# Patient Record
Sex: Female | Born: 1945
Health system: Southern US, Community
[De-identification: ages and names within clinical notes are randomized; demographics above are authoritative.]

## PROBLEM LIST (undated history)

## (undated) DIAGNOSIS — I700 Atherosclerosis of aorta: Secondary | ICD-10-CM

## (undated) DIAGNOSIS — E785 Hyperlipidemia, unspecified: Secondary | ICD-10-CM

## (undated) DIAGNOSIS — N281 Cyst of kidney, acquired: Secondary | ICD-10-CM

## (undated) DIAGNOSIS — I10 Essential (primary) hypertension: Secondary | ICD-10-CM

## (undated) DIAGNOSIS — K219 Gastro-esophageal reflux disease without esophagitis: Secondary | ICD-10-CM

## (undated) DIAGNOSIS — M170 Bilateral primary osteoarthritis of knee: Secondary | ICD-10-CM

## (undated) HISTORY — DX: Hyperlipidemia, unspecified: E78.5

## (undated) HISTORY — DX: Bilateral primary osteoarthritis of knee: M17.0

## (undated) HISTORY — DX: Essential (primary) hypertension: I10

## (undated) HISTORY — DX: Cyst of kidney, acquired: N28.1

## (undated) HISTORY — DX: Atherosclerosis of aorta: I70.0

## (undated) HISTORY — DX: Gastro-esophageal reflux disease without esophagitis: K21.9

## (undated) HISTORY — PX: KNEE SURGERY: SHX244

## (undated) HISTORY — DX: Morbid (severe) obesity due to excess calories: E66.01

## (undated) HISTORY — PX: TONSILLECTOMY: SUR1361

---

## 1989-11-09 HISTORY — PX: CHOLECYSTECTOMY: SHX55

## 2001-05-02 ENCOUNTER — Ambulatory Visit (HOSPITAL_COMMUNITY): Admission: RE | Admit: 2001-05-02 | Discharge: 2001-05-02 | Payer: Self-pay | Admitting: Obstetrics and Gynecology

## 2001-08-11 ENCOUNTER — Ambulatory Visit (HOSPITAL_COMMUNITY): Admission: RE | Admit: 2001-08-11 | Discharge: 2001-08-11 | Payer: Self-pay | Admitting: *Deleted

## 2001-08-11 ENCOUNTER — Encounter: Payer: Self-pay | Admitting: *Deleted

## 2002-07-07 ENCOUNTER — Other Ambulatory Visit: Admission: RE | Admit: 2002-07-07 | Discharge: 2002-07-07 | Payer: Self-pay | Admitting: Obstetrics and Gynecology

## 2003-03-30 ENCOUNTER — Encounter
Admission: RE | Admit: 2003-03-30 | Discharge: 2003-03-30 | Payer: Self-pay | Admitting: Physical Medicine and Rehabilitation

## 2003-03-30 ENCOUNTER — Encounter: Payer: Self-pay | Admitting: Radiology

## 2003-03-30 ENCOUNTER — Encounter: Payer: Self-pay | Admitting: Physical Medicine and Rehabilitation

## 2003-12-03 ENCOUNTER — Ambulatory Visit (HOSPITAL_COMMUNITY): Admission: RE | Admit: 2003-12-03 | Discharge: 2003-12-03 | Payer: Self-pay | Admitting: *Deleted

## 2004-06-16 ENCOUNTER — Encounter: Admission: RE | Admit: 2004-06-16 | Discharge: 2004-06-16 | Payer: Self-pay | Admitting: Orthopedic Surgery

## 2004-12-04 ENCOUNTER — Encounter: Admission: RE | Admit: 2004-12-04 | Discharge: 2004-12-04 | Payer: Self-pay | Admitting: Orthopedic Surgery

## 2005-01-22 ENCOUNTER — Encounter: Admission: RE | Admit: 2005-01-22 | Discharge: 2005-01-22 | Payer: Self-pay | Admitting: Neurosurgery

## 2013-10-24 DIAGNOSIS — Z0000 Encounter for general adult medical examination without abnormal findings: Secondary | ICD-10-CM | POA: Diagnosis not present

## 2013-12-15 DIAGNOSIS — I10 Essential (primary) hypertension: Secondary | ICD-10-CM | POA: Diagnosis not present

## 2013-12-15 DIAGNOSIS — J069 Acute upper respiratory infection, unspecified: Secondary | ICD-10-CM | POA: Diagnosis not present

## 2013-12-15 DIAGNOSIS — E7800 Pure hypercholesterolemia, unspecified: Secondary | ICD-10-CM | POA: Diagnosis not present

## 2014-01-30 DIAGNOSIS — M5430 Sciatica, unspecified side: Secondary | ICD-10-CM | POA: Diagnosis not present

## 2014-02-27 DIAGNOSIS — I10 Essential (primary) hypertension: Secondary | ICD-10-CM | POA: Diagnosis not present

## 2014-02-27 DIAGNOSIS — Z0000 Encounter for general adult medical examination without abnormal findings: Secondary | ICD-10-CM | POA: Diagnosis not present

## 2014-02-27 DIAGNOSIS — K219 Gastro-esophageal reflux disease without esophagitis: Secondary | ICD-10-CM | POA: Diagnosis not present

## 2014-02-27 DIAGNOSIS — Z23 Encounter for immunization: Secondary | ICD-10-CM | POA: Diagnosis not present

## 2014-02-27 DIAGNOSIS — E7800 Pure hypercholesterolemia, unspecified: Secondary | ICD-10-CM | POA: Diagnosis not present

## 2014-02-27 DIAGNOSIS — E669 Obesity, unspecified: Secondary | ICD-10-CM | POA: Diagnosis not present

## 2014-02-27 DIAGNOSIS — Z79899 Other long term (current) drug therapy: Secondary | ICD-10-CM | POA: Diagnosis not present

## 2014-03-02 DIAGNOSIS — M5137 Other intervertebral disc degeneration, lumbosacral region: Secondary | ICD-10-CM | POA: Diagnosis not present

## 2014-03-02 DIAGNOSIS — M5430 Sciatica, unspecified side: Secondary | ICD-10-CM | POA: Diagnosis not present

## 2014-03-02 DIAGNOSIS — M47817 Spondylosis without myelopathy or radiculopathy, lumbosacral region: Secondary | ICD-10-CM | POA: Diagnosis not present

## 2014-07-30 DIAGNOSIS — M545 Low back pain, unspecified: Secondary | ICD-10-CM | POA: Diagnosis not present

## 2014-07-30 DIAGNOSIS — M5137 Other intervertebral disc degeneration, lumbosacral region: Secondary | ICD-10-CM | POA: Diagnosis not present

## 2014-07-30 DIAGNOSIS — M999 Biomechanical lesion, unspecified: Secondary | ICD-10-CM | POA: Diagnosis not present

## 2014-07-31 DIAGNOSIS — M545 Low back pain, unspecified: Secondary | ICD-10-CM | POA: Diagnosis not present

## 2014-07-31 DIAGNOSIS — M5137 Other intervertebral disc degeneration, lumbosacral region: Secondary | ICD-10-CM | POA: Diagnosis not present

## 2014-07-31 DIAGNOSIS — M999 Biomechanical lesion, unspecified: Secondary | ICD-10-CM | POA: Diagnosis not present

## 2014-08-02 DIAGNOSIS — M5137 Other intervertebral disc degeneration, lumbosacral region: Secondary | ICD-10-CM | POA: Diagnosis not present

## 2014-08-02 DIAGNOSIS — M545 Low back pain, unspecified: Secondary | ICD-10-CM | POA: Diagnosis not present

## 2014-08-02 DIAGNOSIS — M999 Biomechanical lesion, unspecified: Secondary | ICD-10-CM | POA: Diagnosis not present

## 2014-08-03 DIAGNOSIS — M5137 Other intervertebral disc degeneration, lumbosacral region: Secondary | ICD-10-CM | POA: Diagnosis not present

## 2014-08-03 DIAGNOSIS — M545 Low back pain, unspecified: Secondary | ICD-10-CM | POA: Diagnosis not present

## 2014-08-03 DIAGNOSIS — M999 Biomechanical lesion, unspecified: Secondary | ICD-10-CM | POA: Diagnosis not present

## 2014-08-06 DIAGNOSIS — M545 Low back pain, unspecified: Secondary | ICD-10-CM | POA: Diagnosis not present

## 2014-08-06 DIAGNOSIS — M5137 Other intervertebral disc degeneration, lumbosacral region: Secondary | ICD-10-CM | POA: Diagnosis not present

## 2014-08-06 DIAGNOSIS — M999 Biomechanical lesion, unspecified: Secondary | ICD-10-CM | POA: Diagnosis not present

## 2014-08-07 DIAGNOSIS — M545 Low back pain, unspecified: Secondary | ICD-10-CM | POA: Diagnosis not present

## 2014-08-07 DIAGNOSIS — M5137 Other intervertebral disc degeneration, lumbosacral region: Secondary | ICD-10-CM | POA: Diagnosis not present

## 2014-08-07 DIAGNOSIS — M999 Biomechanical lesion, unspecified: Secondary | ICD-10-CM | POA: Diagnosis not present

## 2014-08-08 DIAGNOSIS — M545 Low back pain, unspecified: Secondary | ICD-10-CM | POA: Diagnosis not present

## 2014-08-08 DIAGNOSIS — M5137 Other intervertebral disc degeneration, lumbosacral region: Secondary | ICD-10-CM | POA: Diagnosis not present

## 2014-08-08 DIAGNOSIS — M999 Biomechanical lesion, unspecified: Secondary | ICD-10-CM | POA: Diagnosis not present

## 2014-08-15 DIAGNOSIS — M9903 Segmental and somatic dysfunction of lumbar region: Secondary | ICD-10-CM | POA: Diagnosis not present

## 2014-08-15 DIAGNOSIS — M9904 Segmental and somatic dysfunction of sacral region: Secondary | ICD-10-CM | POA: Diagnosis not present

## 2014-08-15 DIAGNOSIS — M5136 Other intervertebral disc degeneration, lumbar region: Secondary | ICD-10-CM | POA: Diagnosis not present

## 2014-08-15 DIAGNOSIS — M5442 Lumbago with sciatica, left side: Secondary | ICD-10-CM | POA: Diagnosis not present

## 2014-08-16 DIAGNOSIS — M9903 Segmental and somatic dysfunction of lumbar region: Secondary | ICD-10-CM | POA: Diagnosis not present

## 2014-08-16 DIAGNOSIS — M5442 Lumbago with sciatica, left side: Secondary | ICD-10-CM | POA: Diagnosis not present

## 2014-08-16 DIAGNOSIS — M9904 Segmental and somatic dysfunction of sacral region: Secondary | ICD-10-CM | POA: Diagnosis not present

## 2014-08-16 DIAGNOSIS — M5136 Other intervertebral disc degeneration, lumbar region: Secondary | ICD-10-CM | POA: Diagnosis not present

## 2014-08-17 DIAGNOSIS — M9903 Segmental and somatic dysfunction of lumbar region: Secondary | ICD-10-CM | POA: Diagnosis not present

## 2014-08-17 DIAGNOSIS — M9904 Segmental and somatic dysfunction of sacral region: Secondary | ICD-10-CM | POA: Diagnosis not present

## 2014-08-17 DIAGNOSIS — M5136 Other intervertebral disc degeneration, lumbar region: Secondary | ICD-10-CM | POA: Diagnosis not present

## 2014-08-17 DIAGNOSIS — M5442 Lumbago with sciatica, left side: Secondary | ICD-10-CM | POA: Diagnosis not present

## 2014-08-22 DIAGNOSIS — M9903 Segmental and somatic dysfunction of lumbar region: Secondary | ICD-10-CM | POA: Diagnosis not present

## 2014-08-22 DIAGNOSIS — M9904 Segmental and somatic dysfunction of sacral region: Secondary | ICD-10-CM | POA: Diagnosis not present

## 2014-08-22 DIAGNOSIS — M5442 Lumbago with sciatica, left side: Secondary | ICD-10-CM | POA: Diagnosis not present

## 2014-08-22 DIAGNOSIS — M5136 Other intervertebral disc degeneration, lumbar region: Secondary | ICD-10-CM | POA: Diagnosis not present

## 2014-08-23 DIAGNOSIS — M9903 Segmental and somatic dysfunction of lumbar region: Secondary | ICD-10-CM | POA: Diagnosis not present

## 2014-08-23 DIAGNOSIS — M5136 Other intervertebral disc degeneration, lumbar region: Secondary | ICD-10-CM | POA: Diagnosis not present

## 2014-08-23 DIAGNOSIS — M9904 Segmental and somatic dysfunction of sacral region: Secondary | ICD-10-CM | POA: Diagnosis not present

## 2014-08-23 DIAGNOSIS — M5442 Lumbago with sciatica, left side: Secondary | ICD-10-CM | POA: Diagnosis not present

## 2014-08-24 DIAGNOSIS — M9904 Segmental and somatic dysfunction of sacral region: Secondary | ICD-10-CM | POA: Diagnosis not present

## 2014-08-24 DIAGNOSIS — M5442 Lumbago with sciatica, left side: Secondary | ICD-10-CM | POA: Diagnosis not present

## 2014-08-24 DIAGNOSIS — M5136 Other intervertebral disc degeneration, lumbar region: Secondary | ICD-10-CM | POA: Diagnosis not present

## 2014-08-24 DIAGNOSIS — M9903 Segmental and somatic dysfunction of lumbar region: Secondary | ICD-10-CM | POA: Diagnosis not present

## 2014-08-30 DIAGNOSIS — M5442 Lumbago with sciatica, left side: Secondary | ICD-10-CM | POA: Diagnosis not present

## 2014-08-30 DIAGNOSIS — M9903 Segmental and somatic dysfunction of lumbar region: Secondary | ICD-10-CM | POA: Diagnosis not present

## 2014-08-30 DIAGNOSIS — M9904 Segmental and somatic dysfunction of sacral region: Secondary | ICD-10-CM | POA: Diagnosis not present

## 2014-08-30 DIAGNOSIS — M5136 Other intervertebral disc degeneration, lumbar region: Secondary | ICD-10-CM | POA: Diagnosis not present

## 2014-08-31 DIAGNOSIS — M5442 Lumbago with sciatica, left side: Secondary | ICD-10-CM | POA: Diagnosis not present

## 2014-08-31 DIAGNOSIS — M5136 Other intervertebral disc degeneration, lumbar region: Secondary | ICD-10-CM | POA: Diagnosis not present

## 2014-08-31 DIAGNOSIS — M9903 Segmental and somatic dysfunction of lumbar region: Secondary | ICD-10-CM | POA: Diagnosis not present

## 2014-08-31 DIAGNOSIS — M9904 Segmental and somatic dysfunction of sacral region: Secondary | ICD-10-CM | POA: Diagnosis not present

## 2014-09-04 DIAGNOSIS — M5136 Other intervertebral disc degeneration, lumbar region: Secondary | ICD-10-CM | POA: Diagnosis not present

## 2014-09-04 DIAGNOSIS — M5442 Lumbago with sciatica, left side: Secondary | ICD-10-CM | POA: Diagnosis not present

## 2014-09-04 DIAGNOSIS — M9904 Segmental and somatic dysfunction of sacral region: Secondary | ICD-10-CM | POA: Diagnosis not present

## 2014-09-04 DIAGNOSIS — M9903 Segmental and somatic dysfunction of lumbar region: Secondary | ICD-10-CM | POA: Diagnosis not present

## 2014-09-05 DIAGNOSIS — M5442 Lumbago with sciatica, left side: Secondary | ICD-10-CM | POA: Diagnosis not present

## 2014-09-05 DIAGNOSIS — M5136 Other intervertebral disc degeneration, lumbar region: Secondary | ICD-10-CM | POA: Diagnosis not present

## 2014-09-05 DIAGNOSIS — M9903 Segmental and somatic dysfunction of lumbar region: Secondary | ICD-10-CM | POA: Diagnosis not present

## 2014-09-05 DIAGNOSIS — M9904 Segmental and somatic dysfunction of sacral region: Secondary | ICD-10-CM | POA: Diagnosis not present

## 2014-09-06 DIAGNOSIS — M5136 Other intervertebral disc degeneration, lumbar region: Secondary | ICD-10-CM | POA: Diagnosis not present

## 2014-09-06 DIAGNOSIS — M9903 Segmental and somatic dysfunction of lumbar region: Secondary | ICD-10-CM | POA: Diagnosis not present

## 2014-09-06 DIAGNOSIS — M5442 Lumbago with sciatica, left side: Secondary | ICD-10-CM | POA: Diagnosis not present

## 2014-09-06 DIAGNOSIS — M9904 Segmental and somatic dysfunction of sacral region: Secondary | ICD-10-CM | POA: Diagnosis not present

## 2014-10-01 DIAGNOSIS — M9903 Segmental and somatic dysfunction of lumbar region: Secondary | ICD-10-CM | POA: Diagnosis not present

## 2014-10-01 DIAGNOSIS — M9904 Segmental and somatic dysfunction of sacral region: Secondary | ICD-10-CM | POA: Diagnosis not present

## 2014-10-01 DIAGNOSIS — M5136 Other intervertebral disc degeneration, lumbar region: Secondary | ICD-10-CM | POA: Diagnosis not present

## 2014-10-01 DIAGNOSIS — M5442 Lumbago with sciatica, left side: Secondary | ICD-10-CM | POA: Diagnosis not present

## 2014-10-15 DIAGNOSIS — M9903 Segmental and somatic dysfunction of lumbar region: Secondary | ICD-10-CM | POA: Diagnosis not present

## 2014-10-15 DIAGNOSIS — M5136 Other intervertebral disc degeneration, lumbar region: Secondary | ICD-10-CM | POA: Diagnosis not present

## 2014-10-15 DIAGNOSIS — M5442 Lumbago with sciatica, left side: Secondary | ICD-10-CM | POA: Diagnosis not present

## 2014-10-15 DIAGNOSIS — M9904 Segmental and somatic dysfunction of sacral region: Secondary | ICD-10-CM | POA: Diagnosis not present

## 2015-02-12 DIAGNOSIS — Z23 Encounter for immunization: Secondary | ICD-10-CM | POA: Diagnosis not present

## 2015-03-21 DIAGNOSIS — E669 Obesity, unspecified: Secondary | ICD-10-CM | POA: Diagnosis not present

## 2015-03-21 DIAGNOSIS — Z0000 Encounter for general adult medical examination without abnormal findings: Secondary | ICD-10-CM | POA: Diagnosis not present

## 2015-03-21 DIAGNOSIS — E780 Pure hypercholesterolemia: Secondary | ICD-10-CM | POA: Diagnosis not present

## 2015-03-21 DIAGNOSIS — I10 Essential (primary) hypertension: Secondary | ICD-10-CM | POA: Diagnosis not present

## 2015-03-21 DIAGNOSIS — Z79899 Other long term (current) drug therapy: Secondary | ICD-10-CM | POA: Diagnosis not present

## 2015-03-21 DIAGNOSIS — K219 Gastro-esophageal reflux disease without esophagitis: Secondary | ICD-10-CM | POA: Diagnosis not present

## 2015-03-26 DIAGNOSIS — Z1231 Encounter for screening mammogram for malignant neoplasm of breast: Secondary | ICD-10-CM | POA: Diagnosis not present

## 2015-03-28 DIAGNOSIS — R7301 Impaired fasting glucose: Secondary | ICD-10-CM | POA: Diagnosis not present

## 2016-02-03 DIAGNOSIS — J329 Chronic sinusitis, unspecified: Secondary | ICD-10-CM | POA: Diagnosis not present

## 2016-03-23 DIAGNOSIS — R739 Hyperglycemia, unspecified: Secondary | ICD-10-CM | POA: Diagnosis not present

## 2016-03-23 DIAGNOSIS — I10 Essential (primary) hypertension: Secondary | ICD-10-CM | POA: Diagnosis not present

## 2016-03-23 DIAGNOSIS — E785 Hyperlipidemia, unspecified: Secondary | ICD-10-CM | POA: Diagnosis not present

## 2016-03-23 DIAGNOSIS — E669 Obesity, unspecified: Secondary | ICD-10-CM | POA: Diagnosis not present

## 2016-03-23 DIAGNOSIS — Z79899 Other long term (current) drug therapy: Secondary | ICD-10-CM | POA: Diagnosis not present

## 2016-03-23 DIAGNOSIS — Z0000 Encounter for general adult medical examination without abnormal findings: Secondary | ICD-10-CM | POA: Diagnosis not present

## 2016-05-08 DIAGNOSIS — J309 Allergic rhinitis, unspecified: Secondary | ICD-10-CM | POA: Diagnosis not present

## 2016-05-08 DIAGNOSIS — R6889 Other general symptoms and signs: Secondary | ICD-10-CM | POA: Diagnosis not present

## 2016-05-08 DIAGNOSIS — J019 Acute sinusitis, unspecified: Secondary | ICD-10-CM | POA: Diagnosis not present

## 2016-05-08 DIAGNOSIS — R51 Headache: Secondary | ICD-10-CM | POA: Diagnosis not present

## 2016-05-19 DIAGNOSIS — J019 Acute sinusitis, unspecified: Secondary | ICD-10-CM | POA: Diagnosis not present

## 2016-05-19 DIAGNOSIS — J309 Allergic rhinitis, unspecified: Secondary | ICD-10-CM | POA: Diagnosis not present

## 2016-05-19 DIAGNOSIS — R6889 Other general symptoms and signs: Secondary | ICD-10-CM | POA: Diagnosis not present

## 2016-05-19 DIAGNOSIS — J3489 Other specified disorders of nose and nasal sinuses: Secondary | ICD-10-CM | POA: Diagnosis not present

## 2016-05-28 DIAGNOSIS — R51 Headache: Secondary | ICD-10-CM | POA: Diagnosis not present

## 2016-05-28 DIAGNOSIS — J019 Acute sinusitis, unspecified: Secondary | ICD-10-CM | POA: Diagnosis not present

## 2016-05-29 DIAGNOSIS — R51 Headache: Secondary | ICD-10-CM | POA: Diagnosis not present

## 2016-05-29 DIAGNOSIS — Z9181 History of falling: Secondary | ICD-10-CM | POA: Diagnosis not present

## 2016-05-29 DIAGNOSIS — Z1389 Encounter for screening for other disorder: Secondary | ICD-10-CM | POA: Diagnosis not present

## 2016-08-14 DIAGNOSIS — Z23 Encounter for immunization: Secondary | ICD-10-CM | POA: Diagnosis not present

## 2017-03-04 DIAGNOSIS — L570 Actinic keratosis: Secondary | ICD-10-CM | POA: Diagnosis not present

## 2017-03-04 DIAGNOSIS — L918 Other hypertrophic disorders of the skin: Secondary | ICD-10-CM | POA: Diagnosis not present

## 2017-03-04 DIAGNOSIS — L578 Other skin changes due to chronic exposure to nonionizing radiation: Secondary | ICD-10-CM | POA: Diagnosis not present

## 2017-03-04 DIAGNOSIS — L814 Other melanin hyperpigmentation: Secondary | ICD-10-CM | POA: Diagnosis not present

## 2017-04-01 DIAGNOSIS — H25813 Combined forms of age-related cataract, bilateral: Secondary | ICD-10-CM | POA: Diagnosis not present

## 2017-04-01 DIAGNOSIS — H5203 Hypermetropia, bilateral: Secondary | ICD-10-CM | POA: Diagnosis not present

## 2017-04-01 DIAGNOSIS — H43393 Other vitreous opacities, bilateral: Secondary | ICD-10-CM | POA: Diagnosis not present

## 2017-04-01 DIAGNOSIS — H43813 Vitreous degeneration, bilateral: Secondary | ICD-10-CM | POA: Diagnosis not present

## 2017-04-01 DIAGNOSIS — H524 Presbyopia: Secondary | ICD-10-CM | POA: Diagnosis not present

## 2017-04-01 DIAGNOSIS — I10 Essential (primary) hypertension: Secondary | ICD-10-CM | POA: Diagnosis not present

## 2017-04-01 DIAGNOSIS — H52223 Regular astigmatism, bilateral: Secondary | ICD-10-CM | POA: Diagnosis not present

## 2017-04-02 DIAGNOSIS — K219 Gastro-esophageal reflux disease without esophagitis: Secondary | ICD-10-CM | POA: Diagnosis not present

## 2017-04-02 DIAGNOSIS — R739 Hyperglycemia, unspecified: Secondary | ICD-10-CM | POA: Diagnosis not present

## 2017-04-02 DIAGNOSIS — E785 Hyperlipidemia, unspecified: Secondary | ICD-10-CM | POA: Diagnosis not present

## 2017-04-02 DIAGNOSIS — Z23 Encounter for immunization: Secondary | ICD-10-CM | POA: Diagnosis not present

## 2017-04-02 DIAGNOSIS — Z0000 Encounter for general adult medical examination without abnormal findings: Secondary | ICD-10-CM | POA: Diagnosis not present

## 2017-04-02 DIAGNOSIS — I10 Essential (primary) hypertension: Secondary | ICD-10-CM | POA: Diagnosis not present

## 2017-04-02 DIAGNOSIS — Z79899 Other long term (current) drug therapy: Secondary | ICD-10-CM | POA: Diagnosis not present

## 2017-04-03 DIAGNOSIS — Z8249 Family history of ischemic heart disease and other diseases of the circulatory system: Secondary | ICD-10-CM | POA: Diagnosis not present

## 2017-04-03 DIAGNOSIS — I161 Hypertensive emergency: Secondary | ICD-10-CM | POA: Diagnosis not present

## 2017-04-03 DIAGNOSIS — K219 Gastro-esophageal reflux disease without esophagitis: Secondary | ICD-10-CM | POA: Diagnosis not present

## 2017-04-03 DIAGNOSIS — Z713 Dietary counseling and surveillance: Secondary | ICD-10-CM | POA: Diagnosis not present

## 2017-04-03 DIAGNOSIS — I10 Essential (primary) hypertension: Secondary | ICD-10-CM | POA: Diagnosis not present

## 2017-04-03 DIAGNOSIS — R079 Chest pain, unspecified: Secondary | ICD-10-CM | POA: Diagnosis not present

## 2017-04-03 DIAGNOSIS — E669 Obesity, unspecified: Secondary | ICD-10-CM | POA: Diagnosis not present

## 2017-04-03 DIAGNOSIS — Z6837 Body mass index (BMI) 37.0-37.9, adult: Secondary | ICD-10-CM | POA: Diagnosis not present

## 2017-04-03 DIAGNOSIS — Z79899 Other long term (current) drug therapy: Secondary | ICD-10-CM | POA: Diagnosis not present

## 2017-04-03 DIAGNOSIS — E785 Hyperlipidemia, unspecified: Secondary | ICD-10-CM | POA: Diagnosis not present

## 2017-04-03 DIAGNOSIS — R0602 Shortness of breath: Secondary | ICD-10-CM | POA: Diagnosis not present

## 2017-04-03 DIAGNOSIS — R0789 Other chest pain: Secondary | ICD-10-CM | POA: Diagnosis not present

## 2017-04-04 DIAGNOSIS — I10 Essential (primary) hypertension: Secondary | ICD-10-CM | POA: Diagnosis not present

## 2017-04-04 DIAGNOSIS — Z8249 Family history of ischemic heart disease and other diseases of the circulatory system: Secondary | ICD-10-CM | POA: Diagnosis not present

## 2017-04-04 DIAGNOSIS — R079 Chest pain, unspecified: Secondary | ICD-10-CM | POA: Diagnosis not present

## 2017-04-04 DIAGNOSIS — E785 Hyperlipidemia, unspecified: Secondary | ICD-10-CM | POA: Diagnosis not present

## 2017-04-04 DIAGNOSIS — I161 Hypertensive emergency: Secondary | ICD-10-CM | POA: Diagnosis not present

## 2017-04-04 DIAGNOSIS — E669 Obesity, unspecified: Secondary | ICD-10-CM | POA: Diagnosis not present

## 2017-04-04 DIAGNOSIS — K219 Gastro-esophageal reflux disease without esophagitis: Secondary | ICD-10-CM | POA: Diagnosis not present

## 2017-04-09 DIAGNOSIS — Z1231 Encounter for screening mammogram for malignant neoplasm of breast: Secondary | ICD-10-CM | POA: Diagnosis not present

## 2017-04-12 DIAGNOSIS — R079 Chest pain, unspecified: Secondary | ICD-10-CM | POA: Diagnosis not present

## 2017-04-12 DIAGNOSIS — I10 Essential (primary) hypertension: Secondary | ICD-10-CM | POA: Diagnosis not present

## 2017-04-12 DIAGNOSIS — K219 Gastro-esophageal reflux disease without esophagitis: Secondary | ICD-10-CM | POA: Diagnosis not present

## 2017-08-06 DIAGNOSIS — D124 Benign neoplasm of descending colon: Secondary | ICD-10-CM | POA: Diagnosis not present

## 2017-08-06 DIAGNOSIS — Z800 Family history of malignant neoplasm of digestive organs: Secondary | ICD-10-CM | POA: Diagnosis not present

## 2017-08-06 DIAGNOSIS — Z1211 Encounter for screening for malignant neoplasm of colon: Secondary | ICD-10-CM | POA: Diagnosis not present

## 2017-08-06 DIAGNOSIS — Z86010 Personal history of colonic polyps: Secondary | ICD-10-CM | POA: Diagnosis not present

## 2017-08-06 DIAGNOSIS — K635 Polyp of colon: Secondary | ICD-10-CM | POA: Diagnosis not present

## 2017-08-24 DIAGNOSIS — Z23 Encounter for immunization: Secondary | ICD-10-CM | POA: Diagnosis not present

## 2017-10-13 DIAGNOSIS — J329 Chronic sinusitis, unspecified: Secondary | ICD-10-CM | POA: Diagnosis not present

## 2017-10-13 DIAGNOSIS — Z6839 Body mass index (BMI) 39.0-39.9, adult: Secondary | ICD-10-CM | POA: Diagnosis not present

## 2017-10-21 DIAGNOSIS — J329 Chronic sinusitis, unspecified: Secondary | ICD-10-CM | POA: Diagnosis not present

## 2018-04-11 DIAGNOSIS — Z0000 Encounter for general adult medical examination without abnormal findings: Secondary | ICD-10-CM | POA: Diagnosis not present

## 2018-04-11 DIAGNOSIS — K219 Gastro-esophageal reflux disease without esophagitis: Secondary | ICD-10-CM | POA: Diagnosis not present

## 2018-04-11 DIAGNOSIS — I10 Essential (primary) hypertension: Secondary | ICD-10-CM | POA: Diagnosis not present

## 2018-04-11 DIAGNOSIS — E785 Hyperlipidemia, unspecified: Secondary | ICD-10-CM | POA: Diagnosis not present

## 2018-04-11 DIAGNOSIS — Z1159 Encounter for screening for other viral diseases: Secondary | ICD-10-CM | POA: Diagnosis not present

## 2018-04-11 DIAGNOSIS — Z79899 Other long term (current) drug therapy: Secondary | ICD-10-CM | POA: Diagnosis not present

## 2018-06-09 ENCOUNTER — Other Ambulatory Visit: Payer: Self-pay

## 2018-08-01 DIAGNOSIS — Z23 Encounter for immunization: Secondary | ICD-10-CM | POA: Diagnosis not present

## 2018-08-02 DIAGNOSIS — L570 Actinic keratosis: Secondary | ICD-10-CM | POA: Diagnosis not present

## 2018-08-02 DIAGNOSIS — L299 Pruritus, unspecified: Secondary | ICD-10-CM | POA: Diagnosis not present

## 2018-08-02 DIAGNOSIS — L821 Other seborrheic keratosis: Secondary | ICD-10-CM | POA: Diagnosis not present

## 2018-11-25 DIAGNOSIS — M5416 Radiculopathy, lumbar region: Secondary | ICD-10-CM | POA: Diagnosis not present

## 2018-11-30 DIAGNOSIS — M545 Low back pain: Secondary | ICD-10-CM | POA: Diagnosis not present

## 2018-11-30 DIAGNOSIS — M5416 Radiculopathy, lumbar region: Secondary | ICD-10-CM | POA: Diagnosis not present

## 2018-12-13 DIAGNOSIS — M7060 Trochanteric bursitis, unspecified hip: Secondary | ICD-10-CM | POA: Diagnosis not present

## 2018-12-13 DIAGNOSIS — M545 Low back pain: Secondary | ICD-10-CM | POA: Diagnosis not present

## 2019-01-09 DIAGNOSIS — J329 Chronic sinusitis, unspecified: Secondary | ICD-10-CM | POA: Diagnosis not present

## 2019-01-24 DIAGNOSIS — M7062 Trochanteric bursitis, left hip: Secondary | ICD-10-CM | POA: Diagnosis not present

## 2019-01-24 DIAGNOSIS — M7060 Trochanteric bursitis, unspecified hip: Secondary | ICD-10-CM | POA: Diagnosis not present

## 2019-01-24 DIAGNOSIS — M545 Low back pain: Secondary | ICD-10-CM | POA: Diagnosis not present

## 2019-03-28 DIAGNOSIS — M7061 Trochanteric bursitis, right hip: Secondary | ICD-10-CM | POA: Diagnosis not present

## 2019-05-04 DIAGNOSIS — Z0000 Encounter for general adult medical examination without abnormal findings: Secondary | ICD-10-CM | POA: Diagnosis not present

## 2019-05-04 DIAGNOSIS — I10 Essential (primary) hypertension: Secondary | ICD-10-CM | POA: Diagnosis not present

## 2019-05-04 DIAGNOSIS — E785 Hyperlipidemia, unspecified: Secondary | ICD-10-CM | POA: Diagnosis not present

## 2019-05-04 DIAGNOSIS — Z79899 Other long term (current) drug therapy: Secondary | ICD-10-CM | POA: Diagnosis not present

## 2019-05-04 DIAGNOSIS — M170 Bilateral primary osteoarthritis of knee: Secondary | ICD-10-CM | POA: Diagnosis not present

## 2019-05-04 DIAGNOSIS — K219 Gastro-esophageal reflux disease without esophagitis: Secondary | ICD-10-CM | POA: Diagnosis not present

## 2019-05-04 DIAGNOSIS — R739 Hyperglycemia, unspecified: Secondary | ICD-10-CM | POA: Diagnosis not present

## 2019-05-18 DIAGNOSIS — Z1231 Encounter for screening mammogram for malignant neoplasm of breast: Secondary | ICD-10-CM | POA: Diagnosis not present

## 2019-06-08 DIAGNOSIS — R922 Inconclusive mammogram: Secondary | ICD-10-CM | POA: Diagnosis not present

## 2019-06-08 DIAGNOSIS — N6002 Solitary cyst of left breast: Secondary | ICD-10-CM | POA: Diagnosis not present

## 2019-06-08 DIAGNOSIS — R928 Other abnormal and inconclusive findings on diagnostic imaging of breast: Secondary | ICD-10-CM | POA: Diagnosis not present

## 2019-06-29 DIAGNOSIS — M7061 Trochanteric bursitis, right hip: Secondary | ICD-10-CM | POA: Diagnosis not present

## 2019-06-29 DIAGNOSIS — M7060 Trochanteric bursitis, unspecified hip: Secondary | ICD-10-CM | POA: Diagnosis not present

## 2019-07-26 DIAGNOSIS — Z23 Encounter for immunization: Secondary | ICD-10-CM | POA: Diagnosis not present

## 2019-09-07 DIAGNOSIS — M545 Low back pain: Secondary | ICD-10-CM | POA: Diagnosis not present

## 2019-09-12 DIAGNOSIS — K589 Irritable bowel syndrome without diarrhea: Secondary | ICD-10-CM | POA: Diagnosis not present

## 2019-09-14 DIAGNOSIS — K589 Irritable bowel syndrome without diarrhea: Secondary | ICD-10-CM | POA: Diagnosis not present

## 2019-10-11 DIAGNOSIS — M1611 Unilateral primary osteoarthritis, right hip: Secondary | ICD-10-CM | POA: Diagnosis not present

## 2019-10-19 DIAGNOSIS — K589 Irritable bowel syndrome without diarrhea: Secondary | ICD-10-CM | POA: Diagnosis not present

## 2019-10-19 DIAGNOSIS — R195 Other fecal abnormalities: Secondary | ICD-10-CM | POA: Diagnosis not present

## 2019-11-20 DIAGNOSIS — R195 Other fecal abnormalities: Secondary | ICD-10-CM | POA: Diagnosis not present

## 2019-11-23 ENCOUNTER — Other Ambulatory Visit: Payer: Self-pay | Admitting: Unknown Physician Specialty

## 2019-11-23 DIAGNOSIS — R195 Other fecal abnormalities: Secondary | ICD-10-CM

## 2019-12-07 ENCOUNTER — Ambulatory Visit
Admission: RE | Admit: 2019-12-07 | Discharge: 2019-12-07 | Disposition: A | Discharge status: Home / Self Care | Payer: Medicare Other | Source: Ambulatory Visit | Attending: Unknown Physician Specialty | Admitting: Unknown Physician Specialty

## 2019-12-07 ENCOUNTER — Other Ambulatory Visit: Payer: Self-pay

## 2019-12-07 DIAGNOSIS — K449 Diaphragmatic hernia without obstruction or gangrene: Secondary | ICD-10-CM | POA: Diagnosis not present

## 2019-12-07 DIAGNOSIS — R195 Other fecal abnormalities: Secondary | ICD-10-CM

## 2019-12-07 DIAGNOSIS — K921 Melena: Secondary | ICD-10-CM | POA: Diagnosis not present

## 2019-12-21 DIAGNOSIS — E785 Hyperlipidemia, unspecified: Secondary | ICD-10-CM | POA: Diagnosis not present

## 2019-12-21 DIAGNOSIS — S39012A Strain of muscle, fascia and tendon of lower back, initial encounter: Secondary | ICD-10-CM | POA: Diagnosis not present

## 2019-12-21 DIAGNOSIS — I700 Atherosclerosis of aorta: Secondary | ICD-10-CM | POA: Diagnosis not present

## 2019-12-21 DIAGNOSIS — N281 Cyst of kidney, acquired: Secondary | ICD-10-CM | POA: Diagnosis not present

## 2020-01-04 DIAGNOSIS — J069 Acute upper respiratory infection, unspecified: Secondary | ICD-10-CM | POA: Diagnosis not present

## 2020-01-04 DIAGNOSIS — U071 COVID-19: Secondary | ICD-10-CM | POA: Diagnosis not present

## 2020-01-07 DIAGNOSIS — I10 Essential (primary) hypertension: Secondary | ICD-10-CM | POA: Diagnosis not present

## 2020-01-07 DIAGNOSIS — E785 Hyperlipidemia, unspecified: Secondary | ICD-10-CM | POA: Diagnosis not present

## 2020-01-07 DIAGNOSIS — K219 Gastro-esophageal reflux disease without esophagitis: Secondary | ICD-10-CM | POA: Diagnosis not present

## 2020-01-10 DIAGNOSIS — U071 COVID-19: Secondary | ICD-10-CM | POA: Diagnosis present

## 2020-01-10 DIAGNOSIS — I10 Essential (primary) hypertension: Secondary | ICD-10-CM | POA: Diagnosis present

## 2020-01-10 DIAGNOSIS — R0902 Hypoxemia: Secondary | ICD-10-CM | POA: Diagnosis not present

## 2020-01-10 DIAGNOSIS — M1990 Unspecified osteoarthritis, unspecified site: Secondary | ICD-10-CM | POA: Diagnosis present

## 2020-01-10 DIAGNOSIS — J9601 Acute respiratory failure with hypoxia: Secondary | ICD-10-CM | POA: Diagnosis not present

## 2020-01-10 DIAGNOSIS — E785 Hyperlipidemia, unspecified: Secondary | ICD-10-CM | POA: Diagnosis present

## 2020-01-10 DIAGNOSIS — Z79899 Other long term (current) drug therapy: Secondary | ICD-10-CM | POA: Diagnosis not present

## 2020-01-10 DIAGNOSIS — R531 Weakness: Secondary | ICD-10-CM | POA: Diagnosis not present

## 2020-01-10 DIAGNOSIS — R05 Cough: Secondary | ICD-10-CM | POA: Diagnosis not present

## 2020-01-11 DIAGNOSIS — J9601 Acute respiratory failure with hypoxia: Secondary | ICD-10-CM | POA: Diagnosis not present

## 2020-01-11 DIAGNOSIS — U071 COVID-19: Secondary | ICD-10-CM | POA: Diagnosis not present

## 2020-01-11 DIAGNOSIS — I10 Essential (primary) hypertension: Secondary | ICD-10-CM | POA: Diagnosis not present

## 2020-01-12 DIAGNOSIS — I10 Essential (primary) hypertension: Secondary | ICD-10-CM | POA: Diagnosis not present

## 2020-01-12 DIAGNOSIS — U071 COVID-19: Secondary | ICD-10-CM | POA: Diagnosis not present

## 2020-01-12 DIAGNOSIS — J9601 Acute respiratory failure with hypoxia: Secondary | ICD-10-CM | POA: Diagnosis not present

## 2020-01-29 DIAGNOSIS — U071 COVID-19: Secondary | ICD-10-CM | POA: Diagnosis not present

## 2020-01-29 DIAGNOSIS — J329 Chronic sinusitis, unspecified: Secondary | ICD-10-CM | POA: Diagnosis not present

## 2020-01-29 DIAGNOSIS — E669 Obesity, unspecified: Secondary | ICD-10-CM | POA: Diagnosis not present

## 2020-01-29 DIAGNOSIS — I700 Atherosclerosis of aorta: Secondary | ICD-10-CM | POA: Diagnosis not present

## 2020-02-01 DIAGNOSIS — R195 Other fecal abnormalities: Secondary | ICD-10-CM | POA: Diagnosis not present

## 2020-02-01 DIAGNOSIS — Z86010 Personal history of colonic polyps: Secondary | ICD-10-CM | POA: Diagnosis not present

## 2020-03-11 DIAGNOSIS — B9689 Other specified bacterial agents as the cause of diseases classified elsewhere: Secondary | ICD-10-CM | POA: Diagnosis not present

## 2020-03-11 DIAGNOSIS — J019 Acute sinusitis, unspecified: Secondary | ICD-10-CM | POA: Diagnosis not present

## 2020-03-11 DIAGNOSIS — Z6836 Body mass index (BMI) 36.0-36.9, adult: Secondary | ICD-10-CM | POA: Diagnosis not present

## 2020-04-11 DIAGNOSIS — M1611 Unilateral primary osteoarthritis, right hip: Secondary | ICD-10-CM | POA: Diagnosis not present

## 2020-05-11 DIAGNOSIS — I10 Essential (primary) hypertension: Secondary | ICD-10-CM | POA: Diagnosis not present

## 2020-05-11 DIAGNOSIS — I351 Nonrheumatic aortic (valve) insufficiency: Secondary | ICD-10-CM | POA: Diagnosis not present

## 2020-05-11 DIAGNOSIS — R200 Anesthesia of skin: Secondary | ICD-10-CM | POA: Diagnosis not present

## 2020-05-11 DIAGNOSIS — R079 Chest pain, unspecified: Secondary | ICD-10-CM | POA: Diagnosis not present

## 2020-05-11 DIAGNOSIS — K219 Gastro-esophageal reflux disease without esophagitis: Secondary | ICD-10-CM | POA: Diagnosis not present

## 2020-05-11 DIAGNOSIS — E669 Obesity, unspecified: Secondary | ICD-10-CM | POA: Diagnosis not present

## 2020-05-11 DIAGNOSIS — R110 Nausea: Secondary | ICD-10-CM | POA: Diagnosis not present

## 2020-05-11 DIAGNOSIS — Z79899 Other long term (current) drug therapy: Secondary | ICD-10-CM | POA: Diagnosis not present

## 2020-05-11 DIAGNOSIS — R0789 Other chest pain: Secondary | ICD-10-CM | POA: Diagnosis not present

## 2020-05-11 DIAGNOSIS — E041 Nontoxic single thyroid nodule: Secondary | ICD-10-CM | POA: Diagnosis not present

## 2020-05-11 DIAGNOSIS — Z8249 Family history of ischemic heart disease and other diseases of the circulatory system: Secondary | ICD-10-CM | POA: Diagnosis not present

## 2020-05-29 DIAGNOSIS — I208 Other forms of angina pectoris: Secondary | ICD-10-CM | POA: Diagnosis not present

## 2020-05-29 DIAGNOSIS — I10 Essential (primary) hypertension: Secondary | ICD-10-CM | POA: Diagnosis not present

## 2020-05-29 DIAGNOSIS — I700 Atherosclerosis of aorta: Secondary | ICD-10-CM | POA: Diagnosis not present

## 2020-05-30 ENCOUNTER — Encounter: Payer: Self-pay | Admitting: Cardiology

## 2020-05-30 ENCOUNTER — Other Ambulatory Visit: Payer: Self-pay

## 2020-05-30 ENCOUNTER — Ambulatory Visit (INDEPENDENT_AMBULATORY_CARE_PROVIDER_SITE_OTHER): Payer: Medicare Other | Admitting: Cardiology

## 2020-05-30 VITALS — BP 190/86 | HR 59 | Ht 63.0 in | Wt 206.4 lb

## 2020-05-30 DIAGNOSIS — I701 Atherosclerosis of renal artery: Secondary | ICD-10-CM | POA: Diagnosis not present

## 2020-05-30 DIAGNOSIS — R072 Precordial pain: Secondary | ICD-10-CM | POA: Diagnosis not present

## 2020-05-30 DIAGNOSIS — E041 Nontoxic single thyroid nodule: Secondary | ICD-10-CM | POA: Diagnosis not present

## 2020-05-30 DIAGNOSIS — R0789 Other chest pain: Secondary | ICD-10-CM

## 2020-05-30 DIAGNOSIS — E042 Nontoxic multinodular goiter: Secondary | ICD-10-CM | POA: Diagnosis not present

## 2020-05-30 MED ORDER — CARVEDILOL 12.5 MG PO TABS: 12.5000 mg | ORAL_TABLET | Freq: Two times a day (BID) | ORAL | 3 refills | Status: DC

## 2020-05-30 NOTE — Progress Notes (Signed)
Cardiology Office Note:    Date:  05/30/2020   ID:  Sara Ramos, DOB 02/12/1946, MRN [id number]  PCP:  Street, Sharon Mt, MD  Cardiologist:  Berniece Salines, DO  Electrophysiologist:  None   Referring MD: Street, Sharon Mt, *   " I am doing well"  History of Present Illness:    Sara Ramos is a 74 y.o. female with a hx of hypertension, hyperlipidemia obesity family history of coronary disease presents today to be evaluated for chest pain.  Patient was referred by her PCP who is with white [facility] physicians.  The patient tells me that over the last few days she has been experiencing intermittent chest pain.  But it started back in July when she started experience substernal chest pain diffuse with radiation to her left jaw and left arm.  She did go to the [facility] ED at which time she was told that she needed to stay over the weekend as it was July 4 weekend and will have her stress test 2 days later.  She left and did see her PCP after that.  He tells me she has had 2 nuclear stress test in the last 5 years which has been normal.  But she concerned that she is still experiencing this chest pain.  She denies any chest pain in the office today.  No shortness of breath.  She also does have uncontrolled hypertension she tells me that she take her medication as prescribed but still does have trouble with keeping her blood pressure under control.  Past Medical History:  Diagnosis Date  . Aortic atherosclerosis ([place])   . Benign essential hypertension   . Dyslipidemia   . GERD (gastroesophageal reflux disease)   . Morbid obesity ([place])   . Primary osteoarthritis of knees, bilateral   . Renal cyst, left     Past Surgical History:  Procedure Laterality Date  . CHOLECYSTECTOMY  1991  . KNEE SURGERY    . TONSILLECTOMY      Current Medications: Current Meds  Medication Sig  . amLODipine (NORVASC) 5 MG tablet Take 5 mg by mouth daily.  Marland Kitchen atorvastatin (LIPITOR) 20 MG  tablet Take 20 mg by mouth daily.  . diphenhydrAMINE HCl (BENADRYL ALLERGY PO) Take by mouth at bedtime.  Marland Kitchen omeprazole (PRILOSEC) 20 MG capsule Take 20 mg by mouth daily.  Marland Kitchen triamterene-hydrochlorothiazide (MAXZIDE-25) 37.5-25 MG tablet Take 1 tablet by mouth daily.  . [DISCONTINUED] metoprolol tartrate (LOPRESSOR) 50 MG tablet Take 50 mg by mouth 2 (two) times daily.     Allergies:   Amoxil [amoxicillin], Aspirin, Cipro [ciprofloxacin in d5w], Dexamethasone, Dyclonine-fd&c blue #1-fd&c red #40-menth [dyclonine hcl], Gabapentin, Latex, Niacin and related, Nitroglycerin, Penicillins, Prednisone, Proxyphylline, Septra [sulfamethoxazole-trimethoprim], and Vioxx [rofecoxib]   Social History   Socioeconomic History  . Marital status: Married    Spouse name: Not on file  . Number of children: Not on file  . Years of education: Not on file  . Highest education level: Not on file  Occupational History  . Not on file  Tobacco Use  . Smoking status: Former Research scientist (life sciences)  . Smokeless tobacco: Never Used  Substance and Sexual Activity  . Alcohol use: Never  . Drug use: Not on file  . Sexual activity: Not on file  Other Topics Concern  . Not on file  Social History Narrative  . Not on file   Social Determinants of Health   Financial Resource Strain:   . Difficulty of Paying Living  Expenses:   Food Insecurity:   . Worried About Charity fundraiser in the Last Year:   . Arboriculturist in the Last Year:   Transportation Needs:   . Film/video editor (Medical):   Marland Kitchen Lack of Transportation (Non-Medical):   Physical Activity:   . Days of Exercise per Week:   . Minutes of Exercise per Session:   Stress:   . Feeling of Stress :   Social Connections:   . Frequency of Communication with Friends and Family:   . Frequency of Social Gatherings with Friends and Family:   . Attends Religious Services:   . Active Member of Clubs or Organizations:   . Attends Archivist Meetings:   Marland Kitchen  Marital Status:      Family History: The patient's family history includes CAD in her mother; Colon cancer in her mother; Diabetes in her mother; Peripheral vascular disease in her father.  ROS:   Review of Systems  Constitution: Negative for decreased appetite, fever and weight gain.  HENT: Negative for congestion, ear discharge, hoarse voice and sore throat.   Eyes: Negative for discharge, redness, vision loss in right eye and visual halos.  Cardiovascular: Negative for chest pain, dyspnea on exertion, leg swelling, orthopnea and palpitations.  Respiratory: Negative for cough, hemoptysis, shortness of breath and snoring.   Endocrine: Negative for heat intolerance and polyphagia.  Hematologic/Lymphatic: Negative for bleeding problem. Does not bruise/bleed easily.  Skin: Negative for flushing, nail changes, rash and suspicious lesions.  Musculoskeletal: Negative for arthritis, joint pain, muscle cramps, myalgias, neck pain and stiffness.  Gastrointestinal: Negative for abdominal pain, bowel incontinence, diarrhea and excessive appetite.  Genitourinary: Negative for decreased libido, genital sores and incomplete emptying.  Neurological: Negative for brief paralysis, focal weakness, headaches and loss of balance.  Psychiatric/Behavioral: Negative for altered mental status, depression and suicidal ideas.  Allergic/Immunologic: Negative for HIV exposure and persistent infections.    EKGs/Labs/Other Studies Reviewed:    The following studies were reviewed today:   EKG:  The ekg ordered today demonstrates sinus rhythm, heart rate 59 bpm poor R wave progression in precordial leads which could be suggestive of old anterior infarction, left axis deviation.  Recent Labs: No results found for requested labs within last 8760 hours.  Recent Lipid Panel No results found for: CHOL, TRIG, HDL, CHOLHDL, VLDL, LDLCALC, LDLDIRECT  Physical Exam:    VS:  BP (!) 190/86 (BP Location: Left Arm)    Pulse 59   Ht _0  (1.6 m)   Wt (!) 206 lb 6.4 oz (93.6 kg)   SpO2 98%   BMI 36.56 kg/m     Wt Readings from Last 3 Encounters:  05/30/20 (!) 206 lb 6.4 oz (93.6 kg)     GEN: Well nourished, well developed in no acute distress HEENT: Normal NECK: No JVD; No carotid bruits LYMPHATICS: No lymphadenopathy CARDIAC: S1S2 noted,RRR, no murmurs, rubs, gallops RESPIRATORY:  Clear to auscultation without rales, wheezing or rhonchi  ABDOMEN: Soft, non-tender, non-distended, +bowel sounds, no guarding. EXTREMITIES: No edema, No cyanosis, no clubbing MUSCULOSKELETAL:  No deformity  SKIN: Warm and dry NEUROLOGIC:  Alert and oriented x 3, non-focal PSYCHIATRIC:  Normal affect, good insight  ASSESSMENT:    1. Chest pressure   2. Renal artery stenosis ([place])   3. Precordial pain    PLAN:     Given your recurrent chest pain and significant family history of coronary artery disease with her risk factors I like to  proceed with her a different modality of testing.  She has had 2 nuclear stress test which has been normal therefore like to proceed with other form of imaging a coronary CTA.  She does not have any IV contrast allergies.  She is willing to proceed with this testing.  Have educated patient about the testing all of her questions has been answered.  She does have significant side effect to nitroglycerin she gets significantly hypotensive she tells me therefore this medication is not going to be prescribed for this patient.  She was still advised that if she has any recurrent chest pain to go to the emergency department to be evaluated.  She is hypertensive in the office today I review her antihypertensive medication she takes amlodipine 5 mg daily triamterene-hydrochlorothiazide 37.5-25 mg, and Lopressor 50 mg twice a day.  I am going to stop the Lopressor and exchanged out for carvedilol 12.5 mg twice daily.  I am hoping this will help with antihypertensive regimen.  Also get an ultrasound  duplex of her renal arteries to rule out renal artery stenosis.  Her morbid obesity-the patient understands the need to lose weight with diet and exercise. We have discussed specific strategies for this.  BMP will be done today to assess her kidney function prior to her coronary CTA  The patient is in agreement with the above plan. The patient left the office in stable condition.  The patient will follow up in 1 month for blood pressure follow-up.   Medication Adjustments/Labs and Tests Ordered: Current medicines are reviewed at length with the patient today.  Concerns regarding medicines are outlined above.  Orders Placed This Encounter  Procedures  . CT CORONARY MORPH W/CTA COR W/SCORE W/CA W/CM &/OR WO/CM  . Basic metabolic panel  . EKG 12-Lead  . VAS US RENAL ARTERY DUPLEX   Meds ordered this encounter  Medications  . carvedilol (COREG) 12.5 MG tablet    Sig: Take 1 tablet (12.5 mg total) by mouth 2 (two) times daily.    Dispense:  180 tablet    Refill:  3    Patient Instructions  Medication Instructions:  Your physician has recommended you make the following change in your medication:   Stop Lopressor Start Coreg 12.5 mg twice daily.  *If you need a refill on your cardiac medications before your next appointment, please call your pharmacy*   Lab Work: Your physician recommends that you return for lab work in: 1 week prior to your CT.  If you have labs (blood work) drawn today and your tests are completely normal, you will receive your results only by: Marland Kitchen MyChart Message (if you have MyChart) OR . A paper copy in the mail If you have any lab test that is abnormal or we need to change your treatment, we will call you to review the results.   Testing/Procedures: Your cardiac CT will be scheduled at one of the below locations:   [facility] [address] [place],  [address] [phone]  If scheduled at [facility], please arrive at  the [facility] main entrance of [facility] 30 minutes prior to test start time. Proceed to the [facility] Radiology Department (first floor) to check-in and test prep.  Please follow these instructions carefully (unless otherwise directed):  On the Night Before the Test: . Be sure to Drink plenty of water. . Do not consume any caffeinated/decaffeinated beverages or chocolate 12 hours prior to your test. . Do  not take any antihistamines 12 hours prior to your test.   On the Day of the Test: . Drink plenty of water. Do not drink any water within one hour of the test. . Do not eat any food 4 hours prior to the test. . You may take your regular medications prior to the test.  . Take Carvedilol (coreg) two hours prior to test. . HOLD Furosemide/Hydrochlorothiazide morning of the test. . FEMALES- please wear underwire-free bra if available       After the Test: . Drink plenty of water. . After receiving IV contrast, you may experience a mild flushed feeling. This is normal. . On occasion, you may experience a mild rash up to 24 hours after the test. This is not dangerous. If this occurs, you can take Benadryl 25 mg and increase your fluid intake. . If you experience trouble breathing, this can be serious. If it is severe call 911 IMMEDIATELY. If it is mild, please call our office. . If you take any of these medications: Glipizide/Metformin, Avandament, Glucavance, please do not take 48 hours after completing test unless otherwise instructed.  Once we have confirmed authorization from your insurance company, we will call you to set up a date and time for your test. Based on how quickly your insurance processes prior authorizations requests, please allow up to 4 weeks to be contacted for scheduling your Cardiac CT appointment. Be advised that routine Cardiac CT appointments could be scheduled as many as 8 weeks after your provider has ordered it.  For non-scheduling related questions,  please contact the cardiac imaging nurse navigator should you have any questions/concerns: Marchia Bond, Cardiac Imaging Nurse Navigator Burley Saver, Interim Cardiac Imaging Nurse [facility] Direct Office Dial: [phone]   For scheduling needs, including cancellations and rescheduling, please call Vivien Rota at [phone], option 3.    Your physician has requested that you have a renal artery duplex. During this test, an ultrasound is used to evaluate blood flow to the kidneys. Allow one hour for this exam. Do not eat after midnight the day before and avoid carbonated beverages. Take your medications as you usually do.    Follow-Up: At [facility], you and your health needs are our priority.  As part of our continuing mission to provide you with exceptional heart care, we have created designated Provider Care Teams.  These Care Teams include your primary Cardiologist (physician) and Advanced Practice Providers (APPs -  Physician Assistants and Nurse Practitioners) who all work together to provide you with the care you need, when you need it.  We recommend signing up for the patient portal called "MyChart".  Sign up information is provided on this After Visit Summary.  MyChart is used to connect with patients for Virtual Visits (Telemedicine).  Patients are able to view lab/test results, encounter notes, upcoming appointments, etc.  Non-urgent messages can be sent to your provider as well.   To learn more about what you can do with MyChart, go to [email or web address].    Your next appointment:   1 month(s)  The format for your next appointment:   In Person  Provider:   Berniece Salines, DO   Other Instructions  Cardiac CT Angiogram A cardiac CT angiogram is a procedure to look at the heart and the area around the heart. It may be done to help find the cause of chest pains or other symptoms of heart disease. During this procedure, a substance called  contrast dye  is injected into the blood vessels in the area to be checked. A large X-ray machine, called a CT scanner, then takes detailed pictures of the heart and the surrounding area. The procedure is also sometimes called a coronary CT angiogram, coronary artery scanning, or CTA. A cardiac CT angiogram allows the health care provider to see how well blood is flowing to and from the heart. The health care provider will be able to see if there are any problems, such as:  Blockage or narrowing of the coronary arteries in the heart.  Fluid around the heart.  Signs of weakness or disease in the muscles, valves, and tissues of the heart. Tell a health care provider about:  Any allergies you have. This is especially important if you have had a previous allergic reaction to contrast dye.  All medicines you are taking, including vitamins, herbs, eye drops, creams, and over-the-counter medicines.  Any blood disorders you have.  Any surgeries you have had.  Any medical conditions you have.  Whether you are pregnant or may be pregnant.  Any anxiety disorders, chronic pain, or other conditions you have that may increase your stress or prevent you from lying still. What are the risks? Generally, this is a safe procedure. However, problems may occur, including:  Bleeding.  Infection.  Allergic reactions to medicines or dyes.  Damage to other structures or organs.  Kidney damage from the contrast dye that is used.  Increased risk of cancer from radiation exposure. This risk is low. Talk with your health care provider about: ? The risks and benefits of testing. ? How you can receive the lowest dose of radiation. What happens before the procedure?  Wear comfortable clothing and remove any jewelry, glasses, dentures, and hearing aids.  Follow instructions from your health care provider about eating and drinking. This may include: ? For 12 hours before the procedure -- avoid caffeine.  This includes tea, coffee, soda, energy drinks, and diet pills. Drink plenty of water or other fluids that do not have caffeine in them. Being well hydrated can prevent complications. ? For 4-6 hours before the procedure -- stop eating and drinking. The contrast dye can cause nausea, but this is less likely if your stomach is empty.  Ask your health care provider about changing or stopping your regular medicines. This is especially important if you are taking diabetes medicines, blood thinners, or medicines to treat problems with erections (erectile dysfunction). What happens during the procedure?   Hair on your chest may need to be removed so that small sticky patches called electrodes can be placed on your chest. These will transmit information that helps to monitor your heart during the procedure.  An IV will be inserted into one of your veins.  You might be given a medicine to control your heart rate during the procedure. This will help to ensure that good images are obtained.  You will be asked to lie on an exam table. This table will slide in and out of the CT machine during the procedure.  Contrast dye will be injected into the IV. You might feel warm, or you may get a metallic taste in your mouth.  You will be given a medicine called nitroglycerin. This will relax or dilate the arteries in your heart.  The table that you are lying on will move into the CT machine tunnel for the scan.  The person running the machine will give you instructions while the scans are being done. You may  be asked to: ? Keep your arms above your head. ? Hold your breath. ? Stay very still, even if the table is moving.  When the scanning is complete, you will be moved out of the machine.  The IV will be removed. The procedure may vary among health care providers and hospitals. What can I expect after the procedure? After your procedure, it is common to have:  A metallic taste in your mouth from the  contrast dye.  A feeling of warmth.  A headache from the nitroglycerin. Follow these instructions at home:  Take over-the-counter and prescription medicines only as told by your health care provider.  If you are told, drink enough fluid to keep your urine pale yellow. This will help to flush the contrast dye out of your body.  Most people can return to their normal activities right after the procedure. Ask your health care provider what activities are safe for you.  It is up to you to get the results of your procedure. Ask your health care provider, or the department that is doing the procedure, when your results will be ready.  Keep all follow-up visits as told by your health care provider. This is important. Contact a health care provider if:  You have any symptoms of allergy to the contrast dye. These include: ? Shortness of breath. ? Rash or hives. ? A racing heartbeat. Summary  A cardiac CT angiogram is a procedure to look at the heart and the area around the heart. It may be done to help find the cause of chest pains or other symptoms of heart disease.  During this procedure, a large X-ray machine, called a CT scanner, takes detailed pictures of the heart and the surrounding area after a contrast dye has been injected into blood vessels in the area.  Ask your health care provider about changing or stopping your regular medicines before the procedure. This is especially important if you are taking diabetes medicines, blood thinners, or medicines to treat erectile dysfunction.  If you are told, drink enough fluid to keep your urine pale yellow. This will help to flush the contrast dye out of your body. This information is not intended to replace advice given to you by your health care provider. Make sure you discuss any questions you have with your health care provider. Document Revised: 06/21/2019 Document Reviewed: 06/21/2019 Elsevier Patient Education  [place].      Adopting a Healthy Lifestyle.  Know what a healthy weight is for you (roughly BMI <25) and aim to maintain this   Aim for 7+ servings of fruits and vegetables daily   65-80+ fluid ounces of water or unsweet tea for healthy kidneys   Limit to max 1 drink of alcohol per day; avoid smoking/tobacco   Limit animal fats in diet for cholesterol and heart health - choose grass fed whenever available   Avoid highly processed foods, and foods high in saturated/trans fats   Aim for low stress - take time to unwind and care for your mental health   Aim for 150 min of moderate intensity exercise weekly for heart health, and weights twice weekly for bone health   Aim for 7-9 hours of sleep daily   When it comes to diets, agreement about the perfect plan isnt easy to find, even among the experts. Experts at the [place] developed an idea known as the Healthy Eating Plate. Just imagine a plate divided into logical, healthy  portions.   The emphasis is on diet quality:   Load up on vegetables and fruits - one-half of your plate: Aim for color and variety, and remember that potatoes dont count.   Go for whole grains - one-quarter of your plate: Whole wheat, barley, wheat berries, quinoa, oats, brown rice, and foods made with them. If you want pasta, go with whole wheat pasta.   Protein power - one-quarter of your plate: Fish, chicken, beans, and nuts are all healthy, versatile protein sources. Limit red meat.   The diet, however, does go beyond the plate, offering a few other suggestions.   Use healthy plant oils, such as olive, canola, soy, corn, sunflower and peanut. Check the labels, and avoid partially hydrogenated oil, which have unhealthy trans fats.   If youre thirsty, drink water. Coffee and tea are good in moderation, but skip sugary drinks and limit milk and dairy products to one or two daily servings.   The type of carbohydrate in the diet is more  important than the amount. Some sources of carbohydrates, such as vegetables, fruits, whole grains, and beans-are healthier than others.   Finally, stay active  Signed, Berniece Salines, DO  05/30/2020 2:45 PM    [facility]

## 2020-05-30 NOTE — Patient Instructions (Signed)
Medication Instructions:  Your physician has recommended you make the following change in your medication:   Stop Lopressor Start Coreg 12.5 mg twice daily.  *If you need a refill on your cardiac medications before your next appointment, please call your pharmacy*   Lab Work: Your physician recommends that you return for lab work in: 1 week prior to your CT.  If you have labs (blood work) drawn today and your tests are completely normal, you will receive your results only by: Marland Kitchen MyChart Message (if you have MyChart) OR . A paper copy in the mail If you have any lab test that is abnormal or we need to change your treatment, we will call you to review the results.   Testing/Procedures: Your cardiac CT will be scheduled at one of the below locations:   [facility] [address] [place], [place] [address] [phone]  If scheduled at [facility], please arrive at the [facility] main entrance of [place] 30 minutes prior to test start time. Proceed to the [facility] Radiology Department (first floor) to check-in and test prep.  Please follow these instructions carefully (unless otherwise directed):  On the Night Before the Test: . Be sure to Drink plenty of water. . Do not consume any caffeinated/decaffeinated beverages or chocolate 12 hours prior to your test. . Do not take any antihistamines 12 hours prior to your test.   On the Day of the Test: . Drink plenty of water. Do not drink any water within one hour of the test. . Do not eat any food 4 hours prior to the test. . You may take your regular medications prior to the test.  . Take Carvedilol (coreg) two hours prior to test. . HOLD Furosemide/Hydrochlorothiazide morning of the test. . FEMALES- please wear underwire-free bra if available       After the Test: . Drink plenty of water. . After receiving IV contrast, you may experience a mild flushed feeling. This is normal. . On  occasion, you may experience a mild rash up to 24 hours after the test. This is not dangerous. If this occurs, you can take Benadryl 25 mg and increase your fluid intake. . If you experience trouble breathing, this can be serious. If it is severe call 911 IMMEDIATELY. If it is mild, please call our office. . If you take any of these medications: Glipizide/Metformin, Avandament, Glucavance, please do not take 48 hours after completing test unless otherwise instructed.  Once we have confirmed authorization from your insurance company, we will call you to set up a date and time for your test. Based on how quickly your insurance processes prior authorizations requests, please allow up to 4 weeks to be contacted for scheduling your Cardiac CT appointment. Be advised that routine Cardiac CT appointments could be scheduled as many as 8 weeks after your provider has ordered it.  For non-scheduling related questions, please contact the cardiac imaging nurse navigator should you have any questions/concerns: Marchia Bond, Cardiac Imaging Nurse Navigator Burley Saver, Interim Cardiac Imaging Nurse [place] [facility] Direct Office Dial: [phone]   For scheduling needs, including cancellations and rescheduling, please call Vivien Rota at [phone], option 3.    Your physician has requested that you have a renal artery duplex. During this test, an ultrasound is used to evaluate blood flow to the kidneys. Allow one hour for this exam. Do not eat after midnight the day before and avoid carbonated beverages.  Take your medications as you usually do.    Follow-Up: At [facility], you and your health needs are our priority.  As part of our continuing mission to provide you with exceptional heart care, we have created designated Provider Care Teams.  These Care Teams include your primary Cardiologist (physician) and Advanced Practice Providers (APPs -  Physician Assistants and Nurse  Practitioners) who all work together to provide you with the care you need, when you need it.  We recommend signing up for the patient portal called "MyChart".  Sign up information is provided on this After Visit Summary.  MyChart is used to connect with patients for Virtual Visits (Telemedicine).  Patients are able to view lab/test results, encounter notes, upcoming appointments, etc.  Non-urgent messages can be sent to your provider as well.   To learn more about what you can do with MyChart, go to [email or web address].    Your next appointment:   1 month(s)  The format for your next appointment:   In Person  Provider:   Berniece Salines, DO   Other Instructions  Cardiac CT Angiogram A cardiac CT angiogram is a procedure to look at the heart and the area around the heart. It may be done to help find the cause of chest pains or other symptoms of heart disease. During this procedure, a substance called contrast dye is injected into the blood vessels in the area to be checked. A large X-ray machine, called a CT scanner, then takes detailed pictures of the heart and the surrounding area. The procedure is also sometimes called a coronary CT angiogram, coronary artery scanning, or CTA. A cardiac CT angiogram allows the health care provider to see how well blood is flowing to and from the heart. The health care provider will be able to see if there are any problems, such as:  Blockage or narrowing of the coronary arteries in the heart.  Fluid around the heart.  Signs of weakness or disease in the muscles, valves, and tissues of the heart. Tell a health care provider about:  Any allergies you have. This is especially important if you have had a previous allergic reaction to contrast dye.  All medicines you are taking, including vitamins, herbs, eye drops, creams, and over-the-counter medicines.  Any blood disorders you have.  Any surgeries you have had.  Any medical conditions you  have.  Whether you are pregnant or may be pregnant.  Any anxiety disorders, chronic pain, or other conditions you have that may increase your stress or prevent you from lying still. What are the risks? Generally, this is a safe procedure. However, problems may occur, including:  Bleeding.  Infection.  Allergic reactions to medicines or dyes.  Damage to other structures or organs.  Kidney damage from the contrast dye that is used.  Increased risk of cancer from radiation exposure. This risk is low. Talk with your health care provider about: ? The risks and benefits of testing. ? How you can receive the lowest dose of radiation. What happens before the procedure?  Wear comfortable clothing and remove any jewelry, glasses, dentures, and hearing aids.  Follow instructions from your health care provider about eating and drinking. This may include: ? For 12 hours before the procedure -- avoid caffeine. This includes tea, coffee, soda, energy drinks, and diet pills. Drink plenty of water or other fluids that do not have caffeine in them. Being well hydrated can prevent complications. ? For 4-6 hours before the procedure -- stop  eating and drinking. The contrast dye can cause nausea, but this is less likely if your stomach is empty.  Ask your health care provider about changing or stopping your regular medicines. This is especially important if you are taking diabetes medicines, blood thinners, or medicines to treat problems with erections (erectile dysfunction). What happens during the procedure?   Hair on your chest may need to be removed so that small sticky patches called electrodes can be placed on your chest. These will transmit information that helps to monitor your heart during the procedure.  An IV will be inserted into one of your veins.  You might be given a medicine to control your heart rate during the procedure. This will help to ensure that good images are obtained.  You  will be asked to lie on an exam table. This table will slide in and out of the CT machine during the procedure.  Contrast dye will be injected into the IV. You might feel warm, or you may get a metallic taste in your mouth.  You will be given a medicine called nitroglycerin. This will relax or dilate the arteries in your heart.  The table that you are lying on will move into the CT machine tunnel for the scan.  The person running the machine will give you instructions while the scans are being done. You may be asked to: ? Keep your arms above your head. ? Hold your breath. ? Stay very still, even if the table is moving.  When the scanning is complete, you will be moved out of the machine.  The IV will be removed. The procedure may vary among health care providers and hospitals. What can I expect after the procedure? After your procedure, it is common to have:  A metallic taste in your mouth from the contrast dye.  A feeling of warmth.  A headache from the nitroglycerin. Follow these instructions at home:  Take over-the-counter and prescription medicines only as told by your health care provider.  If you are told, drink enough fluid to keep your urine pale yellow. This will help to flush the contrast dye out of your body.  Most people can return to their normal activities right after the procedure. Ask your health care provider what activities are safe for you.  It is up to you to get the results of your procedure. Ask your health care provider, or the department that is doing the procedure, when your results will be ready.  Keep all follow-up visits as told by your health care provider. This is important. Contact a health care provider if:  You have any symptoms of allergy to the contrast dye. These include: ? Shortness of breath. ? Rash or hives. ? A racing heartbeat. Summary  A cardiac CT angiogram is a procedure to look at the heart and the area around the heart. It may  be done to help find the cause of chest pains or other symptoms of heart disease.  During this procedure, a large X-ray machine, called a CT scanner, takes detailed pictures of the heart and the surrounding area after a contrast dye has been injected into blood vessels in the area.  Ask your health care provider about changing or stopping your regular medicines before the procedure. This is especially important if you are taking diabetes medicines, blood thinners, or medicines to treat erectile dysfunction.  If you are told, drink enough fluid to keep your urine pale yellow. This will help to flush  the contrast dye out of your body. This information is not intended to replace advice given to you by your health care provider. Make sure you discuss any questions you have with your health care provider. Document Revised: 06/21/2019 Document Reviewed: 06/21/2019 Elsevier Patient Education  [place].

## 2020-06-18 DIAGNOSIS — D440 Neoplasm of uncertain behavior of thyroid gland: Secondary | ICD-10-CM | POA: Diagnosis not present

## 2020-06-18 DIAGNOSIS — D497 Neoplasm of unspecified behavior of endocrine glands and other parts of nervous system: Secondary | ICD-10-CM | POA: Diagnosis not present

## 2020-06-18 DIAGNOSIS — E041 Nontoxic single thyroid nodule: Secondary | ICD-10-CM | POA: Diagnosis not present

## 2020-06-20 ENCOUNTER — Ambulatory Visit (INDEPENDENT_AMBULATORY_CARE_PROVIDER_SITE_OTHER): Payer: Medicare Other

## 2020-06-20 ENCOUNTER — Other Ambulatory Visit: Payer: Self-pay

## 2020-06-20 DIAGNOSIS — I701 Atherosclerosis of renal artery: Secondary | ICD-10-CM | POA: Diagnosis not present

## 2020-06-20 NOTE — Progress Notes (Signed)
Renal artery duplex exam performed.   Jimmy Ramar Nobrega RDCS, RVT  

## 2020-06-24 ENCOUNTER — Telehealth: Payer: Self-pay

## 2020-06-24 NOTE — Telephone Encounter (Signed)
Spoke with patient regarding results and recommendation.  Patient verbalizes understanding and is agreeable to plan of care. Advised patient to call back with any issues or concerns.  

## 2020-06-24 NOTE — Telephone Encounter (Signed)
-----   Message from Berniece Salines, DO sent at 06/23/2020  9:56 PM EDT ----- Please let the patient know that I am looking forward to seeing her on 8/27 to discuss her ultrasound results.

## 2020-06-25 DIAGNOSIS — D440 Neoplasm of uncertain behavior of thyroid gland: Secondary | ICD-10-CM | POA: Diagnosis not present

## 2020-06-27 ENCOUNTER — Telehealth (HOSPITAL_COMMUNITY): Payer: Self-pay | Admitting: Emergency Medicine

## 2020-06-27 NOTE — Telephone Encounter (Signed)
Reaching out to patient to offer assistance regarding upcoming cardiac imaging study; pt verbalizes understanding of appt date/time, parking situation and where to check in, pre-test NPO status and medications ordered, and verified current allergies; name and call back number provided for further questions should they arise Marchia Bond RN Navigator Cardiac Imaging [facility] [phone] office [phone] cell   Plans to have labs tomorrow 8/20 NO NITRO FOR CCTA TEST PER TOBB

## 2020-06-28 DIAGNOSIS — R0789 Other chest pain: Secondary | ICD-10-CM | POA: Diagnosis not present

## 2020-06-28 DIAGNOSIS — R072 Precordial pain: Secondary | ICD-10-CM | POA: Diagnosis not present

## 2020-06-29 LAB — BASIC METABOLIC PANEL
BUN/Creatinine Ratio: 22 (ref 12–28)
BUN: 17 mg/dL (ref 8–27)
CO2: 26 mmol/L (ref 20–29)
Calcium: 8.9 mg/dL (ref 8.7–10.3)
Chloride: 99 mmol/L (ref 96–106)
Creatinine, Ser: 0.77 mg/dL (ref 0.57–1.00)
GFR calc Af Amer: 90 mL/min/{1.73_m2} (ref >59)
GFR calc non Af Amer: 78 mL/min/{1.73_m2} (ref >59)
Glucose: 129 mg/dL — ABNORMAL HIGH (ref 65–99)
Potassium: 3.7 mmol/L (ref 3.5–5.2)
Sodium: 139 mmol/L (ref 134–144)

## 2020-07-01 ENCOUNTER — Telehealth: Payer: Self-pay

## 2020-07-01 ENCOUNTER — Telehealth (HOSPITAL_COMMUNITY): Payer: Self-pay | Admitting: Emergency Medicine

## 2020-07-01 NOTE — Telephone Encounter (Signed)
Entered in error

## 2020-07-01 NOTE — Telephone Encounter (Signed)
-----   Message from Berniece Salines, DO sent at 06/29/2020  1:27 PM EDT -----   Blood glucose elevated otherwise normal labs

## 2020-07-01 NOTE — Telephone Encounter (Signed)
Spoke with patient regarding results and recommendation.  Patient verbalizes understanding and is agreeable to plan of care. Advised patient to call back with any issues or concerns.  

## 2020-07-02 ENCOUNTER — Ambulatory Visit (HOSPITAL_COMMUNITY)
Admission: RE | Admit: 2020-07-02 | Discharge: 2020-07-02 | Disposition: A | Discharge status: Home / Self Care | Payer: Medicare Other | Source: Ambulatory Visit | Attending: Cardiology | Admitting: Cardiology

## 2020-07-02 DIAGNOSIS — R072 Precordial pain: Secondary | ICD-10-CM | POA: Diagnosis not present

## 2020-07-02 IMAGING — CT CT HEART MORP W/ CTA COR W/ SCORE W/ CA W/CM &/OR W/O CM
4 of 7 series · 8 of 20 positions shown, 9 images · non-contrast
Comparison: 05/11/2020 CTA chest from [facility]
COMPARISON: 05/11/2020 CTA chest from [facility]

Addendum:
EXAM:
OVER-READ INTERPRETATION  CT CHEST

The following report is an over-read performed by radiologist Dr.
Yasene Pitawa [REDACTED] on 07/02/2020. This over-read
does not include interpretation of cardiac or coronary anatomy or
pathology. The coronary CTA interpretation by the cardiologist is
attached.
CLINICAL DATA: 73 year old female with chest pain. Medical history
includes hypertension, hyperlipidemia, obesity and family history of
premature coronary artery disease.
Cardiac/Coronary  CT
TECHNIQUE: The patient was scanned on a [employer] Force scanner.

[Series 6: best diast 76 % · axial · 0.39mm/px · z∈[-198,-156]mm · 2 of 316 slices shown, 3 images]
[im 106/316  vessel]
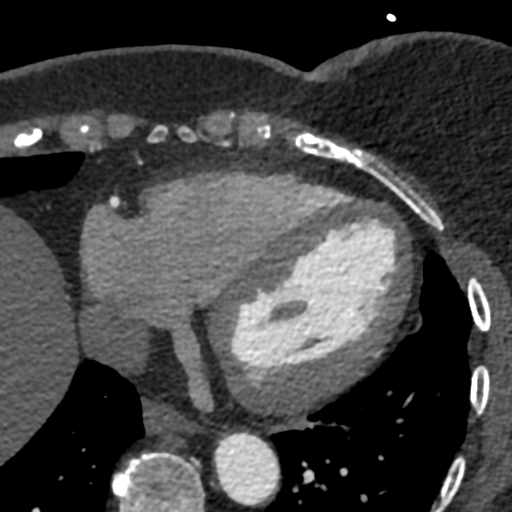
[im 106/316  lung]
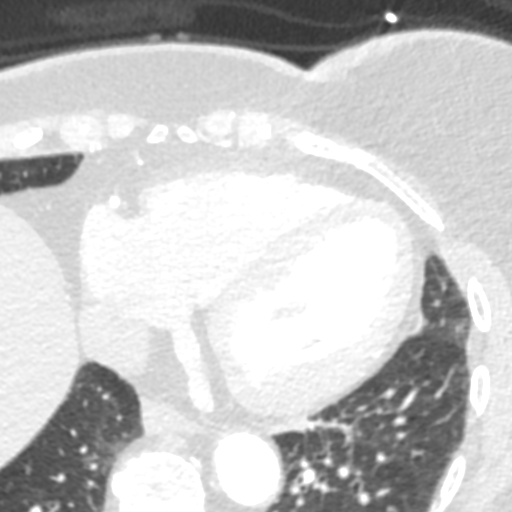
[im 211/316  vessel]
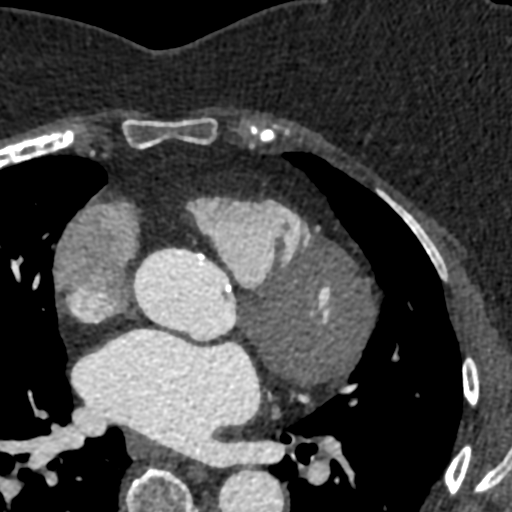

[Series 7: best syst 47 % · axial · 0.39mm/px · z∈[-198,-156]mm · 2 of 316 slices shown]
[im 106/316  vessel]
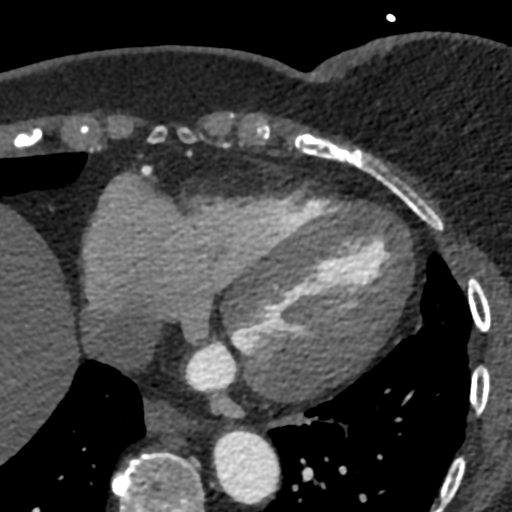
[im 211/316  vessel]
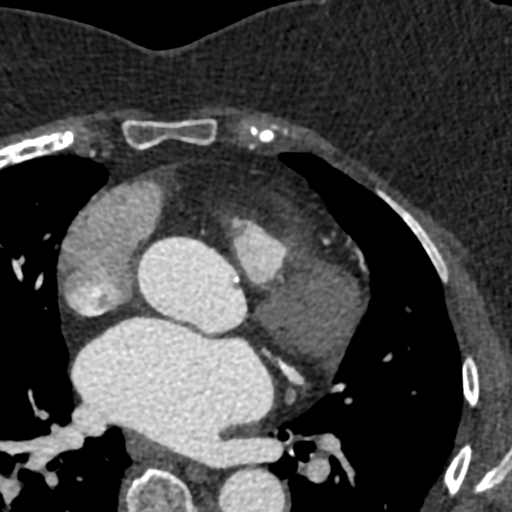

[Series 8: ts diast sharp 76 % · axial · 0.39mm/px · z∈[-198,-156]mm · 2 of 316 slices shown]
[im 106/316  lung]
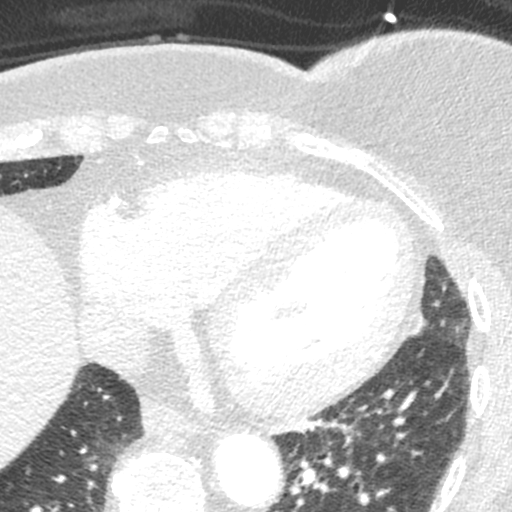
[im 211/316  lung]
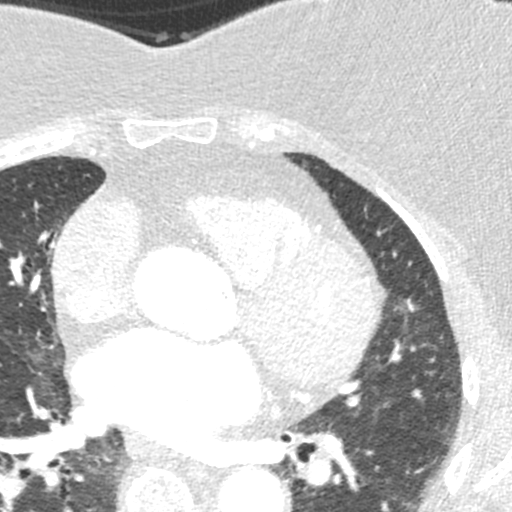

[Series 9: ts syst sharp 47 % · axial · 0.39mm/px · z∈[-198,-156]mm · 2 of 316 slices shown]
[im 106/316  lung]
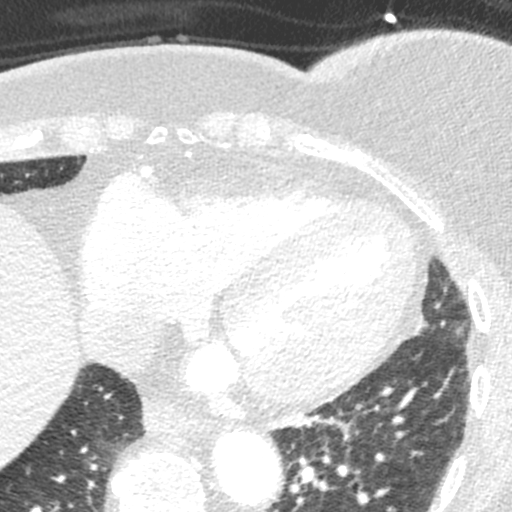
[im 211/316  lung]
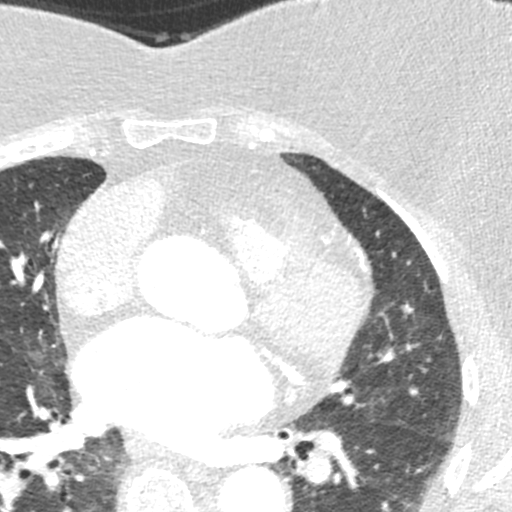

[8 of 20 positions shown; findings below may reference images not displayed]

FINDINGS: Vascular: Aortic atherosclerosis. Tortuous thoracic aorta. No
central pulmonary embolism, on this non-dedicated study.

Mediastinum/Nodes: No imaged thoracic adenopathy. Tiny hiatal
hernia.

Lungs/Pleura: No pleural fluid.  Clear imaged lungs.

Upper Abdomen: Normal imaged portions of the liver, spleen,.

Musculoskeletal: No acute osseous abnormality. Mild right
hemidiaphragm elevation.
IMPRESSION: 1. No acute findings in the imaged extracardiac chest.
2. Tiny hiatal hernia.
3. Aortic Atherosclerosis (IZCTP-VRF.F).
FINDINGS: A 120 kV prospective scan was triggered in the descending thoracic
aorta at 111 HU's. Axial non-contrast 3 mm slices were carried out
through the heart. The data set was analyzed on a dedicated work
station and scored using the Agatson method. Gantry rotation speed
was 250 msecs and collimation was .6 mm. No beta blockade and 0.8 mg
of sl NTG was given. The 3D data set was reconstructed in 5%
intervals of the 67-82 % of the R-R cycle. Diastolic phases were
analyzed on a dedicated work station using MPR, MIP and VRT modes.
The patient received 80 cc of contrast.

Aorta: Normal size. Mild calcifications in the proximal ascending
aorta. No dissection.

Aortic Valve:  Trileaflet.  No calcifications.

Coronary Arteries:  Normal coronary origin.  Right dominance.

RCA is a large dominant artery that gives rise to PDA and PLVB.
There is minimal (<24%) calcified plaques in the proximal to mid
portion of the vessel. The distal portion with no plaques.

Left main is a large artery that gives rise to LAD and LCX arteries.

LAD is a large vessel. The mid LAD with minimal (<24%) mixed plaque.
The proximal and distal portion of LAD with no plaques.

LCX is a non-dominant artery that gives rise to one large OM1
branch. There is no plaque.

Other findings:

Normal pulmonary vein drainage into the left atrium.

Normal left atrial appendage without a thrombus.

Normal size of the pulmonary artery.
IMPRESSION: 1. Coronary calcium score of 75. This was 66 percentile for age and
sex matched control.

2. Normal coronary origin with right dominance.

3. Minimal CAD. Medical management is recommended.

4. Aortic atherosclerosis.

Ferienhaus Erxleben, DO

*** End of Addendum ***
EXAM:
OVER-READ INTERPRETATION  CT CHEST

The following report is an over-read performed by radiologist Dr.
Yasene Pitawa [REDACTED] on 07/02/2020. This over-read
does not include interpretation of cardiac or coronary anatomy or
pathology. The coronary CTA interpretation by the cardiologist is
attached.
FINDINGS: Vascular: Aortic atherosclerosis. Tortuous thoracic aorta. No
central pulmonary embolism, on this non-dedicated study.

Mediastinum/Nodes: No imaged thoracic adenopathy. Tiny hiatal
hernia.

Lungs/Pleura: No pleural fluid.  Clear imaged lungs.

Upper Abdomen: Normal imaged portions of the liver, spleen,.

Musculoskeletal: No acute osseous abnormality. Mild right
hemidiaphragm elevation.
IMPRESSION: 1. No acute findings in the imaged extracardiac chest.
2. Tiny hiatal hernia.
3. Aortic Atherosclerosis (IZCTP-VRF.F).

## 2020-07-02 MED ORDER — IOHEXOL 350 MG/ML SOLN
80.0000 mL | Freq: Once | INTRAVENOUS | Status: AC | PRN
  Administered 2020-07-02: 80 mL via INTRAVENOUS

## 2020-07-05 ENCOUNTER — Encounter: Payer: Self-pay | Admitting: Cardiology

## 2020-07-05 ENCOUNTER — Ambulatory Visit (INDEPENDENT_AMBULATORY_CARE_PROVIDER_SITE_OTHER): Payer: Medicare Other | Admitting: Cardiology

## 2020-07-05 ENCOUNTER — Other Ambulatory Visit: Payer: Self-pay

## 2020-07-05 VITALS — BP 132/82 | HR 72 | Ht 63.0 in | Wt 205.0 lb

## 2020-07-05 DIAGNOSIS — I2510 Atherosclerotic heart disease of native coronary artery without angina pectoris: Secondary | ICD-10-CM

## 2020-07-05 DIAGNOSIS — E669 Obesity, unspecified: Secondary | ICD-10-CM | POA: Diagnosis not present

## 2020-07-05 DIAGNOSIS — I774 Celiac artery compression syndrome: Secondary | ICD-10-CM | POA: Diagnosis not present

## 2020-07-05 DIAGNOSIS — E782 Mixed hyperlipidemia: Secondary | ICD-10-CM

## 2020-07-05 DIAGNOSIS — I10 Essential (primary) hypertension: Secondary | ICD-10-CM | POA: Diagnosis not present

## 2020-07-05 DIAGNOSIS — I771 Stricture of artery: Secondary | ICD-10-CM

## 2020-07-05 HISTORY — DX: Celiac artery compression syndrome: I77.4

## 2020-07-05 HISTORY — DX: Essential (primary) hypertension: I10

## 2020-07-05 HISTORY — DX: Obesity, unspecified: E66.9

## 2020-07-05 HISTORY — DX: Mixed hyperlipidemia: E78.2

## 2020-07-05 HISTORY — DX: Atherosclerotic heart disease of native coronary artery without angina pectoris: I25.10

## 2020-07-05 MED ORDER — ATORVASTATIN CALCIUM 40 MG PO TABS: 40.0000 mg | ORAL_TABLET | Freq: Every day | ORAL | 3 refills | Status: DC

## 2020-07-05 MED ORDER — ISOSORBIDE MONONITRATE ER 30 MG PO TB24: 15.0000 mg | ORAL_TABLET | Freq: Every day | ORAL | 3 refills | Status: DC

## 2020-07-05 NOTE — Progress Notes (Signed)
Cardiology Office Note:    Date:  07/05/2020   ID:  Sara Ramos, DOB [birth year], MRN [id number]  PCP:  Street, Sharon Mt, MD  Cardiologist:  Berniece Salines, DO  Electrophysiologist:  None   Referring MD: Street, Sharon Mt, *   Chief Complaint  Patient presents with  . Follow-up    History of Present Illness:    Sara Ramos is a 74 y.o. female with a hx of hypertension, hyperlipidemia, obesity,  coronary artery disease on her coronary CTA presents today for follow-up visit.  I last saw the patient May 30, 2020 at that time plain a chest pressure therefore sent her for coronary CTA.  She was also hypertensive therefore I stopped her Lopressor and exchanged for carvedilol 12.5 mg daily.  The patient was recommended to also get a coronary CTA as well as ultrasound of the kidneys.  She is here today for follow-up visit.  Patient is able to get her testing done in the interim.  The patient tells me that she still is experiencing intermittent chest pain.  Past Medical History:  Diagnosis Date  . Aortic atherosclerosis ([place])   . Benign essential hypertension   . Dyslipidemia   . GERD (gastroesophageal reflux disease)   . Morbid obesity ([place])   . Primary osteoarthritis of knees, bilateral   . Renal cyst, left     Past Surgical History:  Procedure Laterality Date  . CHOLECYSTECTOMY  1991  . KNEE SURGERY    . TONSILLECTOMY      Current Medications: Current Meds  Medication Sig  . amLODipine (NORVASC) 5 MG tablet Take 5 mg by mouth daily.  Marland Kitchen aspirin EC 81 MG tablet Take 81 mg by mouth 3 (three) times a week. Swallow whole.  Marland Kitchen atorvastatin (LIPITOR) 20 MG tablet Take 20 mg by mouth daily.  . carvedilol (COREG) 12.5 MG tablet Take 1 tablet (12.5 mg total) by mouth 2 (two) times daily.  . diphenhydrAMINE HCl (BENADRYL ALLERGY PO) Take by mouth at bedtime.  . Melatonin 1 MG CAPS Take by mouth at bedtime.  Marland Kitchen omeprazole (PRILOSEC) 20 MG capsule Take 20 mg by mouth daily.    . Saccharomyces boulardii (PROBIOTIC) 250 MG CAPS daily.  Marland Kitchen triamterene-hydrochlorothiazide (MAXZIDE-25) 37.5-25 MG tablet Take 1 tablet by mouth daily.     Allergies:   Amoxil [amoxicillin], Aspirin, Cipro [ciprofloxacin in d5w], Dexamethasone, Dyclonine-fd&c blue #1-fd&c red #40-menth [dyclonine hcl], Gabapentin, Latex, Niacin and related, Nitroglycerin, Penicillins, Prednisone, Proxyphylline, Septra [sulfamethoxazole-trimethoprim], and Vioxx [rofecoxib]   Social History   Socioeconomic History  . Marital status: Married    Spouse name: Not on file  . Number of children: Not on file  . Years of education: Not on file  . Highest education level: Not on file  Occupational History  . Not on file  Tobacco Use  . Smoking status: Former Research scientist (life sciences)  . Smokeless tobacco: Never Used  Substance and Sexual Activity  . Alcohol use: Never  . Drug use: Not on file  . Sexual activity: Not on file  Other Topics Concern  . Not on file  Social History Narrative  . Not on file   Social Determinants of Health   Financial Resource Strain:   . Difficulty of Paying Living Expenses: Not on file  Food Insecurity:   . Worried About Charity fundraiser in the Last Year: Not on file  . Ran Out of Food in the Last Year: Not on file  Transportation Needs:   .  Lack of Transportation (Medical): Not on file  . Lack of Transportation (Non-Medical): Not on file  Physical Activity:   . Days of Exercise per Week: Not on file  . Minutes of Exercise per Session: Not on file  Stress:   . Feeling of Stress : Not on file  Social Connections:   . Frequency of Communication with Friends and Family: Not on file  . Frequency of Social Gatherings with Friends and Family: Not on file  . Attends Religious Services: Not on file  . Active Member of Clubs or Organizations: Not on file  . Attends Archivist Meetings: Not on file  . Marital Status: Not on file     Family History: The patient's family  history includes CAD in her mother; Colon cancer in her mother; Diabetes in her mother; Peripheral vascular disease in her father.  ROS:   Review of Systems  Constitution: Negative for decreased appetite, fever and weight gain.  HENT: Negative for congestion, ear discharge, hoarse voice and sore throat.   Eyes: Negative for discharge, redness, vision loss in right eye and visual halos.  Cardiovascular: Negative for chest pain, dyspnea on exertion, leg swelling, orthopnea and palpitations.  Respiratory: Negative for cough, hemoptysis, shortness of breath and snoring.   Endocrine: Negative for heat intolerance and polyphagia.  Hematologic/Lymphatic: Negative for bleeding problem. Does not bruise/bleed easily.  Skin: Negative for flushing, nail changes, rash and suspicious lesions.  Musculoskeletal: Negative for arthritis, joint pain, muscle cramps, myalgias, neck pain and stiffness.  Gastrointestinal: Negative for abdominal pain, bowel incontinence, diarrhea and excessive appetite.  Genitourinary: Negative for decreased libido, genital sores and incomplete emptying.  Neurological: Negative for brief paralysis, focal weakness, headaches and loss of balance.  Psychiatric/Behavioral: Negative for altered mental status, depression and suicidal ideas.  Allergic/Immunologic: Negative for HIV exposure and persistent infections.    EKGs/Labs/Other Studies Reviewed:    The following studies were reviewed today:   EKG: None today  Bilateral renal ultrasound 06/20/2020 Renal:    Right: Normal size right kidney. No evidence of right renal artery     stenosis.  Left: Cyst(s) noted. Normal size of left kidney. Abnormal cortical     thickness of the left kidney. No evidence of left renal     artery stenosis. 6.37 x 6.77 cystic structure seen.  Mesenteric:  70 to 99% stenosis in the celiac artery.  Increased SMA velocity.    Cardiac CT Aorta: Normal size. Mild calcifications in  the proximal ascending aorta. No dissection.  Aortic Valve:  Trileaflet.  No calcifications.  Coronary Arteries:  Normal coronary origin.  Right dominance.  RCA is a large dominant artery that gives rise to PDA and PLVB. There is minimal (<24%) calcified plaques in the proximal to mid portion of the vessel. The distal portion with no plaques.  Left main is a large artery that gives rise to LAD and LCX arteries.  LAD is a large vessel. The mid LAD with minimal (<24%) mixed plaque. The proximal and distal portion of LAD with no plaques.  LCX is a non-dominant artery that gives rise to one large OM1 branch. There is no plaque.  Other findings:  Normal pulmonary vein drainage into the left atrium.  Normal left atrial appendage without a thrombus.  Normal size of the pulmonary artery.  IMPRESSION: 1. Coronary calcium score of 75. This was 22 percentile for age and sex matched control.  2. Normal coronary origin with right dominance.  3. Minimal CAD. Medical  management is recommended.  4. Aortic atherosclerosis.     Recent Labs: 06/28/2020: BUN 17; Creatinine, Ser 0.77; Potassium 3.7; Sodium 139  Recent Lipid Panel No results found for: CHOL, TRIG, HDL, CHOLHDL, VLDL, LDLCALC, LDLDIRECT  Physical Exam:    VS:  BP 132/82 (BP Location: Left Arm, Patient Position: Sitting, Cuff Size: Large)   Pulse 72   Ht 5\' 3"  (1.6 m)   Wt 205 lb (93 kg)   SpO2 97%   BMI 36.31 kg/m     Wt Readings from Last 3 Encounters:  07/05/20 205 lb (93 kg)  05/30/20 (!) 206 lb 6.4 oz (93.6 kg)     GEN: Well nourished, well developed in no acute distress HEENT: Normal NECK: No JVD; No carotid bruits LYMPHATICS: No lymphadenopathy CARDIAC: S1S2 noted,RRR, no murmurs, rubs, gallops RESPIRATORY:  Clear to auscultation without rales, wheezing or rhonchi  ABDOMEN: Soft, non-tender, non-distended, +bowel sounds, no guarding. EXTREMITIES: No edema, No cyanosis, no  clubbing MUSCULOSKELETAL:  No deformity  SKIN: Warm and dry NEUROLOGIC:  Alert and oriented x 3, non-focal PSYCHIATRIC:  Normal affect, good insight  ASSESSMENT:    1. Coronary artery disease involving native coronary artery of native heart without angina pectoris   2. Essential hypertension   3. Mixed hyperlipidemia   4. Stenosis of celiac artery (HCC)   5. Obesity (BMI 30-39.9)    PLAN:     Also will discuss her coronary CTA results with her and explained the diagnosis of coronary artery disease.  She has been able to tolerate the aspirin 81 mg daily.  I am going to increase her Lipitor to 40 mg daily.  At her next visit we will repeat her lipid profile.  Due to her intermittent chest pain I am going to give the patient Imdur 15 mg daily hopefully this will help.  Her blood pressure has improved since the change in her medication and is at target now.  Her renal ultrasound showed evidence of left celiac artery stenosis I am going to refer the patient to vascular surgery.  The patient understands the need to lose weight with diet and exercise. We have discussed specific strategies for this.  The patient is in agreement with the above plan. The patient left the office in stable condition.  The patient will follow up in in 3 months or sooner if needed   Medication Adjustments/Labs and Tests Ordered: Current medicines are reviewed at length with the patient today.  Concerns regarding medicines are outlined above.  No orders of the defined types were placed in this encounter.  No orders of the defined types were placed in this encounter.   There are no Patient Instructions on file for this visit.   Adopting a Healthy Lifestyle.  Know what a healthy weight is for you (roughly BMI <25) and aim to maintain this   Aim for 7+ servings of fruits and vegetables daily   65-80+ fluid ounces of water or unsweet tea for healthy kidneys   Limit to max 1 drink of alcohol per day; avoid  smoking/tobacco   Limit animal fats in diet for cholesterol and heart health - choose grass fed whenever available   Avoid highly processed foods, and foods high in saturated/trans fats   Aim for low stress - take time to unwind and care for your mental health   Aim for 150 min of moderate intensity exercise weekly for heart health, and weights twice weekly for bone health   Aim for 7-9  hours of sleep daily   When it comes to diets, agreement about the perfect plan isnt easy to find, even among the experts. Experts at the [employer] developed an idea known as the Healthy Eating Plate. Just imagine a plate divided into logical, healthy portions.   The emphasis is on diet quality:   Load up on vegetables and fruits - one-half of your plate: Aim for color and variety, and remember that potatoes dont count.   Go for whole grains - one-quarter of your plate: Whole wheat, barley, wheat berries, quinoa, oats, brown rice, and foods made with them. If you want pasta, go with whole wheat pasta.   Protein power - one-quarter of your plate: Fish, chicken, beans, and nuts are all healthy, versatile protein sources. Limit red meat.   The diet, however, does go beyond the plate, offering a few other suggestions.   Use healthy plant oils, such as olive, canola, soy, corn, sunflower and peanut. Check the labels, and avoid partially hydrogenated oil, which have unhealthy trans fats.   If youre thirsty, drink water. Coffee and tea are good in moderation, but skip sugary drinks and limit milk and dairy products to one or two daily servings.   The type of carbohydrate in the diet is more important than the amount. Some sources of carbohydrates, such as vegetables, fruits, whole grains, and beans-are healthier than others.   Finally, stay active  Signed, Berniece Salines, DO  07/05/2020 1:35 PM     [facility]

## 2020-07-05 NOTE — Patient Instructions (Addendum)
Medication Instructions:  Your physician has recommended you make the following change in your medication: 1.  START Imdur 30 mg taking 1/2 tablet daily  2.  INCREASE Lipitor to 40 mg taking 1 daily  *If you need a refill on your cardiac medications before your next appointment, please call your pharmacy*   Lab Work: None ordered  If you have labs (blood work) drawn today and your tests are completely normal, you will receive your results only by: Marland Kitchen MyChart Message (if you have MyChart) OR . A paper copy in the mail If you have any lab test that is abnormal or we need to change your treatment, we will call you to review the results.   Testing/Procedures: None ordered  You have been referred to Dr. Donzetta Matters at [facility].  They will contact you with an appointment   Follow-Up: At [facility], you and your health needs are our priority.  As part of our continuing mission to provide you with exceptional heart care, we have created designated Provider Care Teams.  These Care Teams include your primary Cardiologist (physician) and Advanced Practice Providers (APPs -  Physician Assistants and Nurse Practitioners) who all work together to provide you with the care you need, when you need it.  We recommend signing up for the patient portal called "MyChart".  Sign up information is provided on this After Visit Summary.  MyChart is used to connect with patients for Virtual Visits (Telemedicine).  Patients are able to view lab/test results, encounter notes, upcoming appointments, etc.  Non-urgent messages can be sent to your provider as well.   To learn more about what you can do with MyChart, go to [email or web address].    Your next appointment:   3 month(s)  The format for your next appointment:   In Person  Provider:   Berniece Salines, DO   Other Instructions

## 2020-07-10 DIAGNOSIS — E041 Nontoxic single thyroid nodule: Secondary | ICD-10-CM | POA: Diagnosis not present

## 2020-07-16 ENCOUNTER — Telehealth: Payer: Self-pay | Admitting: Cardiology

## 2020-07-16 ENCOUNTER — Other Ambulatory Visit: Payer: Self-pay | Admitting: Cardiology

## 2020-07-16 MED ORDER — RANOLAZINE ER 500 MG PO TB12: 500.0000 mg | ORAL_TABLET | Freq: Two times a day (BID) | ORAL | 1 refills | Status: DC

## 2020-07-16 NOTE — Addendum Note (Signed)
Addended by: Ashok Norris on: 07/16/2020 12:36 PM   Modules accepted: Orders

## 2020-07-16 NOTE — Telephone Encounter (Signed)
     Pt c/o medication issue:  1. Name of Medication:   isosorbide mononitrate (IMDUR) 30 MG 24 hr tablet    2. How are you currently taking this medication (dosage and times per day)? Take 0.5 tablets (15 mg total) by mouth daily.  3. Are you having a reaction (difficulty breathing--STAT)?   4. What is your medication issue? Pt said she's been taking this medication for a week now, every time she takes it after an hour it gives her terrible headache and last for 12 hours, she would like to ask Dr. Harriet Masson for an alterative medication

## 2020-07-16 NOTE — Telephone Encounter (Signed)
Have her stop the Imdur, and start her on Ranexa 500 mg twice daily.

## 2020-07-16 NOTE — Telephone Encounter (Signed)
Called patient. Informed her per Dr. Harriet Masson to stop imdur and start ranexa 500 mg twice daily. Patient verbally understood. No further questions.

## 2020-08-21 DIAGNOSIS — Z23 Encounter for immunization: Secondary | ICD-10-CM | POA: Diagnosis not present

## 2020-08-23 ENCOUNTER — Ambulatory Visit (INDEPENDENT_AMBULATORY_CARE_PROVIDER_SITE_OTHER): Payer: Medicare Other | Admitting: Vascular Surgery

## 2020-08-23 ENCOUNTER — Other Ambulatory Visit: Payer: Self-pay

## 2020-08-23 ENCOUNTER — Encounter: Payer: Self-pay | Admitting: Vascular Surgery

## 2020-08-23 VITALS — BP 138/81 | HR 67 | Temp 97.6°F | Resp 14 | Ht 63.0 in | Wt 196.0 lb

## 2020-08-23 DIAGNOSIS — I774 Celiac artery compression syndrome: Secondary | ICD-10-CM | POA: Diagnosis not present

## 2020-08-23 DIAGNOSIS — I771 Stricture of artery: Secondary | ICD-10-CM

## 2020-08-23 NOTE — Progress Notes (Signed)
Patient ID: Sara Ramos, female   DOB: [birth year], 74 y.o.   MRN: [id number]  Reason for Consult: New Patient (Initial Visit) (celiac artery stenosis)   Referred by Berniece Salines, DO  Subjective:     HPI:  Sara Ramos is a 74 y.o. female with history of hypertension, dyslipidemia and former smoking quit many years ago.  Was evaluated for renal artery stenosis found to have carotid and SMA stenosis.  She does not have any recent weight loss.  She notes no food fear or abdominal pain with eating.  She does have to take laxative but otherwise her bowels are working fine.  She has not had any renal dysfunction.  She is on 3 medications for her blood pressure.  Patient cannot take aspirin due to severe intolerance she is on a statin drug.  Past Medical History:  Diagnosis Date  . Aortic atherosclerosis ([place])   . Benign essential hypertension   . Dyslipidemia   . GERD (gastroesophageal reflux disease)   . Morbid obesity ([place])   . Primary osteoarthritis of knees, bilateral   . Renal cyst, left    Family History  Problem Relation Age of Onset  . Colon cancer Mother   . CAD Mother   . Diabetes Mother   . Peripheral vascular disease Father    Past Surgical History:  Procedure Laterality Date  . CHOLECYSTECTOMY  1991  . KNEE SURGERY    . TONSILLECTOMY      Short Social History:  Social History   Tobacco Use  . Smoking status: Former Research scientist (life sciences)  . Smokeless tobacco: Never Used  Substance Use Topics  . Alcohol use: Never    Allergies  Allergen Reactions  . Amoxil [Amoxicillin]   . Aspirin   . Cipro [Ciprofloxacin In D5w]   . Dexamethasone   . Dyclonine-Fd&C Blue #1-Fd&C Red #40-Menth [Dyclonine Hcl]     Yellow #5  . Gabapentin   . Latex   . Niacin And Related   . Nitroglycerin   . Penicillins   . Prednisone   . Proxyphylline   . Septra [Sulfamethoxazole-Trimethoprim]   . Vioxx [Rofecoxib]     Current Outpatient Medications  Medication Sig Dispense Refill  .  amLODipine (NORVASC) 5 MG tablet Take 5 mg by mouth daily.    Marland Kitchen atorvastatin (LIPITOR) 40 MG tablet Take 1 tablet (40 mg total) by mouth daily. 90 tablet 3  . carvedilol (COREG) 12.5 MG tablet Take 1 tablet (12.5 mg total) by mouth 2 (two) times daily. 180 tablet 3  . diphenhydrAMINE HCl (BENADRYL ALLERGY PO) Take by mouth at bedtime.    . Melatonin 1 MG CAPS Take by mouth at bedtime.    Marland Kitchen omeprazole (PRILOSEC) 20 MG capsule Take 20 mg by mouth daily.    . ranolazine (RANEXA) 500 MG 12 hr tablet TAKE 1 TABLET(500 MG) BY MOUTH TWICE DAILY 180 tablet 2  . Saccharomyces boulardii (PROBIOTIC) 250 MG CAPS daily.    Marland Kitchen triamterene-hydrochlorothiazide (MAXZIDE-25) 37.5-25 MG tablet Take 1 tablet by mouth daily.    Marland Kitchen aspirin EC 81 MG tablet Take 81 mg by mouth 3 (three) times a week. Swallow whole.     No current facility-administered medications for this visit.    Review of Systems  Constitutional:  Constitutional negative. HENT: HENT negative.  Eyes: Eyes negative.  Respiratory: Respiratory negative.  Cardiovascular: Cardiovascular negative.  GI: Gastrointestinal negative.  Musculoskeletal: Musculoskeletal negative.  Skin: Skin negative.  Neurological: Neurological negative. Hematologic: Hematologic/lymphatic negative.  Psychiatric: Psychiatric negative.        Objective:  Objective   Vitals:   08/23/20 0904  BP: 138/81  Pulse: 67  Resp: 14  Temp: 97.6 F (36.4 C)  TempSrc: Temporal  SpO2: 98%  Weight: 196 lb (88.9 kg)  Height: 5\' 3"  (1.6 m)   Body mass index is 34.72 kg/m.  Physical Exam Constitutional:      Appearance: Normal appearance. She is obese.  HENT:     Head: Normocephalic.     Nose:     Comments: Wearing a mask Eyes:     Pupils: Pupils are equal, round, and reactive to light.  Cardiovascular:     Rate and Rhythm: Normal rate.     Pulses: Normal pulses.  Pulmonary:     Effort: Pulmonary effort is normal.  Abdominal:     General: Abdomen is flat. There  is no distension.     Palpations: Abdomen is soft. There is no mass.  Musculoskeletal:        General: No swelling. Normal range of motion.     Cervical back: Normal range of motion and neck supple.  Skin:    General: Skin is warm and dry.     Capillary Refill: Capillary refill takes less than 2 seconds.  Neurological:     General: No focal deficit present.     Mental Status: She is alert.  Psychiatric:        Mood and Affect: Mood normal.        Behavior: Behavior normal.        Thought Content: Thought content normal.        Judgment: Judgment normal.     Data: Duplex Findings:  +--------------------+--------+--------+------+--------+  Mesenteric     PSV cm/sEDV cm/sPlaqueComments  +--------------------+--------+--------+------+--------+  Aorta Prox       93               +--------------------+--------+--------+------+--------+  Celiac Artery Origin 296               +--------------------+--------+--------+------+--------+  SMA Proximal     273               +--------------------+--------+--------+------+--------+     +------------------+--------+--------+-------+  Right Renal ArteryPSV cm/sEDV cm/sComment  +------------------+--------+--------+-------+  Origin       174    39        +------------------+--------+--------+-------+  Proximal      149    39        +------------------+--------+--------+-------+  Mid         148    35        +------------------+--------+--------+-------+  Distal       114    27        +------------------+--------+--------+-------+   +-----------------+--------+--------+-------+  Left Renal ArteryPSV cm/sEDV cm/sComment  +-----------------+--------+--------+-------+  Origin       110    27        +-----------------+--------+--------+-------+  Proximal       117    29        +-----------------+--------+--------+-------+  Mid        159    34        +-----------------+--------+--------+-------+  Distal        71    19        +-----------------+--------+--------+-------+   +------------+--------+--------+----+-----------+--------+--------+----+  Right KidneyPSV cm/sEDV cm/sRI Left KidneyPSV cm/sEDV cm/sRI   +------------+--------+--------+----+-----------+--------+--------+----+  Upper Pole 39   11   0.73Upper Pole 23   7  0.71  +------------+--------+--------+----+-----------+--------+--------+----+  Mid     36   10   0.72Mid    34   9    0.75  +------------+--------+--------+----+-----------+--------+--------+----+  Lower Pole 19   8    0.60Lower Pole 24   8    0.67  +------------+--------+--------+----+-----------+--------+--------+----+  Hilar    82   23   0.72Hilar   72   17   0.76  +------------+--------+--------+----+-----------+--------+--------+----+   +------------------+--------+------------------+--------+  Right Kidney       Left Kidney          +------------------+--------+------------------+--------+  RAR            RAR              +------------------+--------+------------------+--------+  RAR (manual)       RAR (manual)         +------------------+--------+------------------+--------+  Cortex          Cortex            +------------------+--------+------------------+--------+  Cortex thickness 19.00 mmCorex thickness  39.00 mm  +------------------+--------+------------------+--------+  Kidney length (cm)10.69  Kidney length (cm)10.92    +------------------+--------+------------------+--------+     Summary:  Renal:    Right: Normal size right kidney. No  evidence of right renal artery     stenosis.  Left: Cyst(s) noted. Normal size of left kidney. Abnormal cortical     thickness of the left kidney. No evidence of left renal     artery stenosis. 6.37 x 6.77 cystic structure seen.  Mesenteric:  70 to 99% stenosis in the celiac artery.  Increased SMA velocity.        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  She does not have any symptoms from this.  Patient cannot take aspirin due to intolerance she does take a statin drug.  I reviewed her recent CT colonography which seems to suggest she may have a component of median arcuate ligament syndrome causing her high velocities but she does appear asymptomatic from this I would not suggest any intervention.  She does not have heavy calcification of either her celiac or SMA by the recent noncontrasted CT.  I discussed with her the unlikely need for any intervention in the future.  She will follow-up in 1 year with repeat dedicated mesenteric duplex while fasting and if at that time there is no change and she is asymptomatic we can likely follow her an every 2-year time period.     Waynetta Sandy MD [facility]

## 2020-09-07 ENCOUNTER — Other Ambulatory Visit: Payer: Self-pay | Admitting: Cardiology

## 2020-09-12 DIAGNOSIS — J321 Chronic frontal sinusitis: Secondary | ICD-10-CM | POA: Diagnosis not present

## 2020-10-07 ENCOUNTER — Other Ambulatory Visit: Payer: Self-pay

## 2020-10-07 DIAGNOSIS — I700 Atherosclerosis of aorta: Secondary | ICD-10-CM | POA: Insufficient documentation

## 2020-10-07 DIAGNOSIS — I10 Essential (primary) hypertension: Secondary | ICD-10-CM | POA: Insufficient documentation

## 2020-10-07 DIAGNOSIS — N281 Cyst of kidney, acquired: Secondary | ICD-10-CM | POA: Insufficient documentation

## 2020-10-07 DIAGNOSIS — M170 Bilateral primary osteoarthritis of knee: Secondary | ICD-10-CM | POA: Insufficient documentation

## 2020-10-07 DIAGNOSIS — E785 Hyperlipidemia, unspecified: Secondary | ICD-10-CM | POA: Insufficient documentation

## 2020-10-07 DIAGNOSIS — K219 Gastro-esophageal reflux disease without esophagitis: Secondary | ICD-10-CM | POA: Insufficient documentation

## 2020-10-09 ENCOUNTER — Encounter: Payer: Self-pay | Admitting: Cardiology

## 2020-10-09 ENCOUNTER — Ambulatory Visit (INDEPENDENT_AMBULATORY_CARE_PROVIDER_SITE_OTHER): Payer: Medicare Other | Admitting: Cardiology

## 2020-10-09 ENCOUNTER — Other Ambulatory Visit: Payer: Self-pay

## 2020-10-09 VITALS — BP 142/76 | HR 70 | Ht 63.0 in | Wt 187.8 lb

## 2020-10-09 DIAGNOSIS — E669 Obesity, unspecified: Secondary | ICD-10-CM

## 2020-10-09 DIAGNOSIS — E782 Mixed hyperlipidemia: Secondary | ICD-10-CM

## 2020-10-09 DIAGNOSIS — I10 Essential (primary) hypertension: Secondary | ICD-10-CM

## 2020-10-09 DIAGNOSIS — I2510 Atherosclerotic heart disease of native coronary artery without angina pectoris: Secondary | ICD-10-CM | POA: Diagnosis not present

## 2020-10-09 NOTE — Patient Instructions (Signed)

## 2020-10-09 NOTE — Progress Notes (Signed)
Cardiology Office Note:    Date:  10/09/2020   ID:  Sara Ramos, DOB 05-Jul-1946, MRN [id number]  PCP:  Street, Sharon Mt, MD  Cardiologist:  Berniece Salines, DO  Electrophysiologist:  None   Referring MD: Street, Sharon Mt, *   I am doing well  History of Present Illness:    Sara Ramos is a 74 y.o. female with a hx of hypertension, hyperlipidemia, obesity, coronary artery disease based on her coronary CTA is here today for follow-up.  Saw the patient in August 2021 at that time she reported that she had been experiencing some intermittent chest pain yesterday her Imdur.  She started Imdur but has significant debilitating headache we stop this medication and placed the patient on Ranexa. She has been experiencing good relief with her Ranexa she tells me she has been actively working on her weight and has had a good diet regimen. She is lost some weight and she is happy with it. Her only concern was the cause of the Ranexa.  Past Medical History:  Diagnosis Date  . Aortic atherosclerosis ([place])   . Benign essential hypertension   . Dyslipidemia   . GERD (gastroesophageal reflux disease)   . Morbid obesity ([place])   . Primary osteoarthritis of knees, bilateral   . Renal cyst, left     Past Surgical History:  Procedure Laterality Date  . CHOLECYSTECTOMY  1991  . KNEE SURGERY    . TONSILLECTOMY      Current Medications: Current Meds  Medication Sig  . amLODipine (NORVASC) 5 MG tablet Take 5 mg by mouth daily.  Marland Kitchen atorvastatin (LIPITOR) 40 MG tablet Take 1 tablet (40 mg total) by mouth daily.  . carvedilol (COREG) 12.5 MG tablet Take 1 tablet (12.5 mg total) by mouth 2 (two) times daily.  . diphenhydrAMINE HCl (BENADRYL ALLERGY PO) Take by mouth at bedtime.  . Melatonin 1 MG CAPS Take by mouth at bedtime.  Marland Kitchen omeprazole (PRILOSEC) 20 MG capsule Take 20 mg by mouth daily.  . ranolazine (RANEXA) 500 MG 12 hr tablet TAKE 1 TABLET(500 MG) BY MOUTH TWICE DAILY  .  triamterene-hydrochlorothiazide (MAXZIDE-25) 37.5-25 MG tablet Take 1 tablet by mouth daily.     Allergies:   Amoxil [amoxicillin], Aspirin, Cipro [ciprofloxacin in d5w], Dexamethasone, Dyclonine-fd&c blue #1-fd&c red #40-menth [dyclonine hcl], Gabapentin, Latex, Niacin and related, Nitroglycerin, Penicillins, Prednisone, Proxyphylline, Septra [sulfamethoxazole-trimethoprim], and Vioxx [rofecoxib]   Social History   Socioeconomic History  . Marital status: Married    Spouse name: Not on file  . Number of children: Not on file  . Years of education: Not on file  . Highest education level: Not on file  Occupational History  . Not on file  Tobacco Use  . Smoking status: Former Research scientist (life sciences)  . Smokeless tobacco: Never Used  Substance and Sexual Activity  . Alcohol use: Never  . Drug use: Not on file  . Sexual activity: Not on file  Other Topics Concern  . Not on file  Social History Narrative  . Not on file   Social Determinants of Health   Financial Resource Strain:   . Difficulty of Paying Living Expenses: Not on file  Food Insecurity:   . Worried About Charity fundraiser in the Last Year: Not on file  . Ran Out of Food in the Last Year: Not on file  Transportation Needs:   . Lack of Transportation (Medical): Not on file  . Lack of Transportation (Non-Medical): Not on file  Physical Activity:   . Days of Exercise per Week: Not on file  . Minutes of Exercise per Session: Not on file  Stress:   . Feeling of Stress : Not on file  Social Connections:   . Frequency of Communication with Friends and Family: Not on file  . Frequency of Social Gatherings with Friends and Family: Not on file  . Attends Religious Services: Not on file  . Active Member of Clubs or Organizations: Not on file  . Attends Archivist Meetings: Not on file  . Marital Status: Not on file     Family History: The patient's family history includes CAD in her mother; Colon cancer in her mother;  Diabetes in her mother; Peripheral vascular disease in her father.  ROS:   Review of Systems  Constitution: Negative for decreased appetite, fever and weight gain.  HENT: Negative for congestion, ear discharge, hoarse voice and sore throat.   Eyes: Negative for discharge, redness, vision loss in right eye and visual halos.  Cardiovascular: Negative for chest pain, dyspnea on exertion, leg swelling, orthopnea and palpitations.  Respiratory: Negative for cough, hemoptysis, shortness of breath and snoring.   Endocrine: Negative for heat intolerance and polyphagia.  Hematologic/Lymphatic: Negative for bleeding problem. Does not bruise/bleed easily.  Skin: Negative for flushing, nail changes, rash and suspicious lesions.  Musculoskeletal: Negative for arthritis, joint pain, muscle cramps, myalgias, neck pain and stiffness.  Gastrointestinal: Negative for abdominal pain, bowel incontinence, diarrhea and excessive appetite.  Genitourinary: Negative for decreased libido, genital sores and incomplete emptying.  Neurological: Negative for brief paralysis, focal weakness, headaches and loss of balance.  Psychiatric/Behavioral: Negative for altered mental status, depression and suicidal ideas.  Allergic/Immunologic: Negative for HIV exposure and persistent infections.    EKGs/Labs/Other Studies Reviewed:    The following studies were reviewed today:   EKG:  The ekg ordered today demonstrates   Recent Labs: 06/28/2020: BUN 17; Creatinine, Ser 0.77; Potassium 3.7; Sodium 139  Recent Lipid Panel No results found for: CHOL, TRIG, HDL, CHOLHDL, VLDL, LDLCALC, LDLDIRECT  Physical Exam:    VS:  BP (!) 142/76   Pulse 70   Ht 5\' 3"  (1.6 m)   Wt 187 lb 12.8 oz (85.2 kg)   SpO2 96%   BMI 33.27 kg/m     Wt Readings from Last 3 Encounters:  10/09/20 187 lb 12.8 oz (85.2 kg)  08/23/20 196 lb (88.9 kg)  07/05/20 205 lb (93 kg)     GEN: Well nourished, well developed in no acute distress HEENT:  Normal NECK: No JVD; No carotid bruits LYMPHATICS: No lymphadenopathy CARDIAC: S1S2 noted,RRR, no murmurs, rubs, gallops RESPIRATORY:  Clear to auscultation without rales, wheezing or rhonchi  ABDOMEN: Soft, non-tender, non-distended, +bowel sounds, no guarding. EXTREMITIES: No edema, No cyanosis, no clubbing MUSCULOSKELETAL:  No deformity  SKIN: Warm and dry NEUROLOGIC:  Alert and oriented x 3, non-focal PSYCHIATRIC:  Normal affect, good insight  ASSESSMENT:    1. Coronary artery disease involving native coronary artery of native heart without angina pectoris   2. Essential hypertension   3. Mixed hyperlipidemia   4. Obesity (BMI 30-39.9)    PLAN:     We discussed affordability for the Ranexa and possible assistance programs, GoodRx coupons and she possibly change her pharmacy.  The patient would like to stay on this medication for now.  In addition her blood pressure is acceptable I am not going to change any medication in terms of her antihypertensive regimen. She  continues to exercise and lose weight this is her goal. No more angina symptoms.  The patient is in agreement with the above plan. The patient left the office in stable condition.  The patient will follow up in 12 months or sooner if needed.   Medication Adjustments/Labs and Tests Ordered: Current medicines are reviewed at length with the patient today.  Concerns regarding medicines are outlined above.  No orders of the defined types were placed in this encounter.  No orders of the defined types were placed in this encounter.   Patient Instructions  Medication Instructions:  Your physician recommends that you continue on your current medications as directed. Please refer to the Current Medication list given to you today.  *If you need a refill on your cardiac medications before your next appointment, please call your pharmacy*   Lab Work: None If you have labs (blood work) drawn today and your tests are  completely normal, you will receive your results only by: Marland Kitchen MyChart Message (if you have MyChart) OR . A paper copy in the mail If you have any lab test that is abnormal or we need to change your treatment, we will call you to review the results.   Testing/Procedures: None   Follow-Up: At [facility], you and your health needs are our priority.  As part of our continuing mission to provide you with exceptional heart care, we have created designated Provider Care Teams.  These Care Teams include your primary Cardiologist (physician) and Advanced Practice Providers (APPs -  Physician Assistants and Nurse Practitioners) who all work together to provide you with the care you need, when you need it.  We recommend signing up for the patient portal called "MyChart".  Sign up information is provided on this After Visit Summary.  MyChart is used to connect with patients for Virtual Visits (Telemedicine).  Patients are able to view lab/test results, encounter notes, upcoming appointments, etc.  Non-urgent messages can be sent to your provider as well.   To learn more about what you can do with MyChart, go to [email or web address].    Your next appointment:   1 year(s)  The format for your next appointment:   In Person  Provider:   Shirlee More, MD   Other Instructions      Adopting a Healthy Lifestyle.  Know what a healthy weight is for you (roughly BMI <25) and aim to maintain this   Aim for 7+ servings of fruits and vegetables daily   65-80+ fluid ounces of water or unsweet tea for healthy kidneys   Limit to max 1 drink of alcohol per day; avoid smoking/tobacco   Limit animal fats in diet for cholesterol and heart health - choose grass fed whenever available   Avoid highly processed foods, and foods high in saturated/trans fats   Aim for low stress - take time to unwind and care for your mental health   Aim for 150 min of moderate intensity exercise weekly for heart  health, and weights twice weekly for bone health   Aim for 7-9 hours of sleep daily   When it comes to diets, agreement about the perfect plan isnt easy to find, even among the experts. Experts at the [employer] developed an idea known as the Healthy Eating Plate. Just imagine a plate divided into logical, healthy portions.   The emphasis is on diet quality:   Load up on vegetables and fruits - one-half of your plate: Aim for color  and variety, and remember that potatoes dont count.   Go for whole grains - one-quarter of your plate: Whole wheat, barley, wheat berries, quinoa, oats, brown rice, and foods made with them. If you want pasta, go with whole wheat pasta.   Protein power - one-quarter of your plate: Fish, chicken, beans, and nuts are all healthy, versatile protein sources. Limit red meat.   The diet, however, does go beyond the plate, offering a few other suggestions.   Use healthy plant oils, such as olive, canola, soy, corn, sunflower and peanut. Check the labels, and avoid partially hydrogenated oil, which have unhealthy trans fats.   If youre thirsty, drink water. Coffee and tea are good in moderation, but skip sugary drinks and limit milk and dairy products to one or two daily servings.   The type of carbohydrate in the diet is more important than the amount. Some sources of carbohydrates, such as vegetables, fruits, whole grains, and beans-are healthier than others.   Finally, stay active  Signed, Berniece Salines, DO  10/09/2020 12:18 PM    [place]

## 2020-10-28 DIAGNOSIS — J40 Bronchitis, not specified as acute or chronic: Secondary | ICD-10-CM | POA: Diagnosis not present

## 2020-10-28 DIAGNOSIS — Z20828 Contact with and (suspected) exposure to other viral communicable diseases: Secondary | ICD-10-CM | POA: Diagnosis not present

## 2020-10-28 DIAGNOSIS — J329 Chronic sinusitis, unspecified: Secondary | ICD-10-CM | POA: Diagnosis not present

## 2021-01-01 ENCOUNTER — Other Ambulatory Visit: Payer: Self-pay

## 2021-01-01 MED ORDER — RANOLAZINE ER 500 MG PO TB12: ORAL_TABLET | ORAL | 3 refills | Status: DC

## 2021-01-01 NOTE — Telephone Encounter (Signed)
This is a [facility] pt, Dr. Tobb 

## 2021-01-21 DIAGNOSIS — M7061 Trochanteric bursitis, right hip: Secondary | ICD-10-CM | POA: Diagnosis not present

## 2021-02-22 ENCOUNTER — Other Ambulatory Visit: Payer: Self-pay | Admitting: Cardiology

## 2021-02-24 DIAGNOSIS — R0989 Other specified symptoms and signs involving the circulatory and respiratory systems: Secondary | ICD-10-CM | POA: Diagnosis not present

## 2021-02-24 DIAGNOSIS — D497 Neoplasm of unspecified behavior of endocrine glands and other parts of nervous system: Secondary | ICD-10-CM | POA: Diagnosis not present

## 2021-02-24 DIAGNOSIS — I10 Essential (primary) hypertension: Secondary | ICD-10-CM | POA: Diagnosis not present

## 2021-02-24 DIAGNOSIS — I700 Atherosclerosis of aorta: Secondary | ICD-10-CM | POA: Diagnosis not present

## 2021-02-24 NOTE — Telephone Encounter (Signed)
Carvedilol 12.5 mg # 180 tablet with 3 additional refills to pharmacy

## 2021-02-27 DIAGNOSIS — J029 Acute pharyngitis, unspecified: Secondary | ICD-10-CM | POA: Diagnosis not present

## 2021-03-01 IMAGING — CT CT VIRTUAL COLONOSCOPY DIAGNOSTIC
3 of 9 series · 15 of 46 positions shown, 17 images · non-contrast
Comparison: Abdominopelvic CT 08/25/2012.

CLINICAL DATA: Blood in stool in [REDACTED]. History of uterine
conization. Cholecystectomy.

EXAM:
CT VIRTUAL COLONOSCOPY DIAGNOSTIC
TECHNIQUE: The patient was given a standard bowel preparation with Gastrografin
and barium for fluid and stool tagging respectively. The quality of
the bowel preparation is good. Automated CO2 insufflation of the
colon was performed prior to image acquisition and colonic
distention is moderate to good. Image post processing was used to
generate a 3D endoluminal fly-through projection of the colon and to
electronically subtract stool/fluid as appropriate.

[Series 4: supine colon 1.50 br40 s3 supine thins · axial · 0.81mm/px · z∈[+1259,+1370]mm · 3 of 335 slices shown]
[im 38/335  soft-tissue]
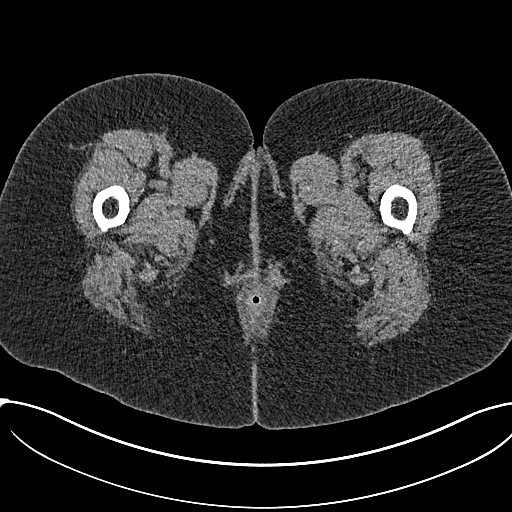
[im 75/335  soft-tissue]
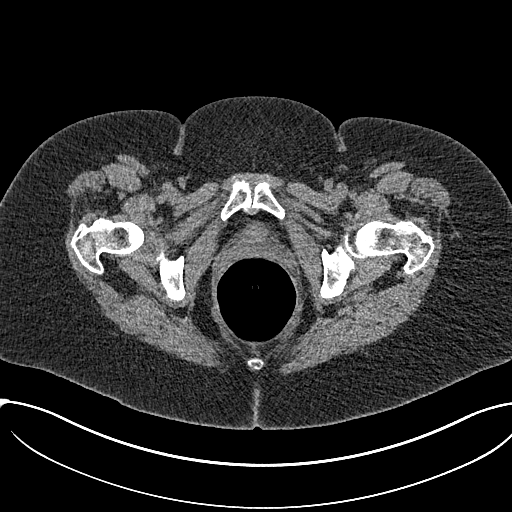
[im 112/335  soft-tissue]
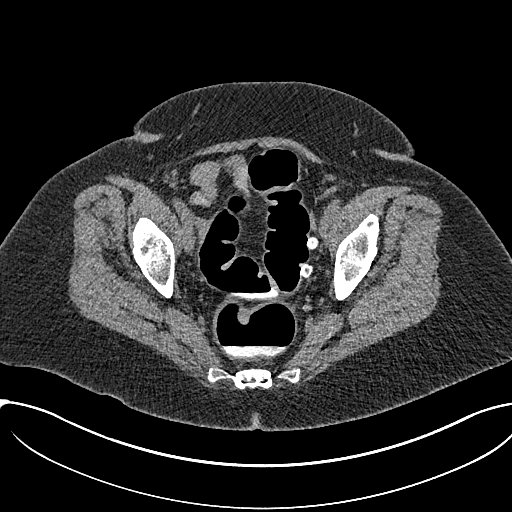

[Series 6: supine colon 3.00 br40 s3 cor supine · coronal · 0.81mm/px · 3 of 105 slices shown]
[im 27/105  soft-tissue]
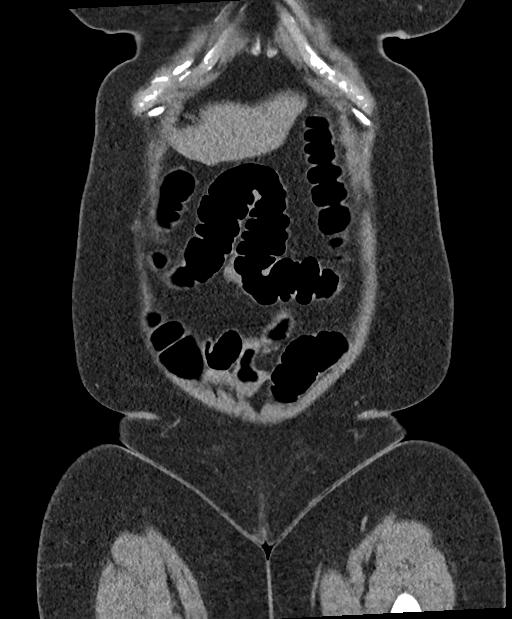
[im 53/105  soft-tissue]
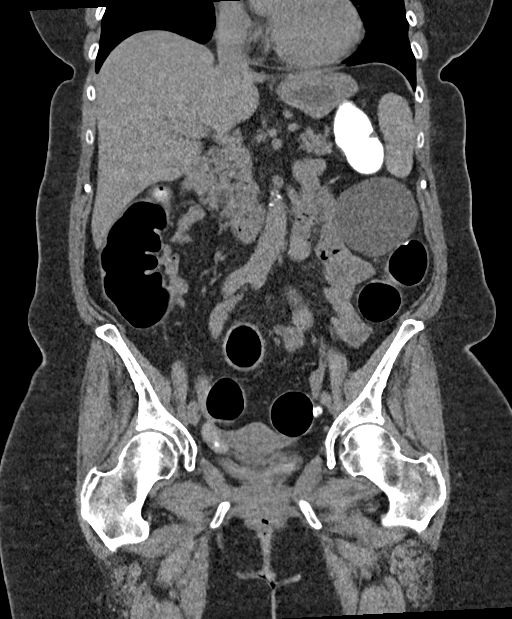
[im 79/105  soft-tissue]
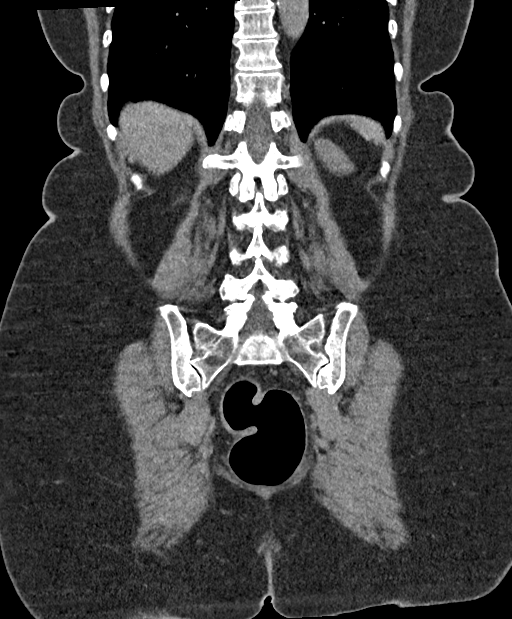

[Series 11: prone colon 1.50 br40 s3 prone thin · axial · 0.88mm/px · z∈[+1308,+1730]mm · 9 of 353 slices shown, 11 images]
[im 36/353  soft-tissue]
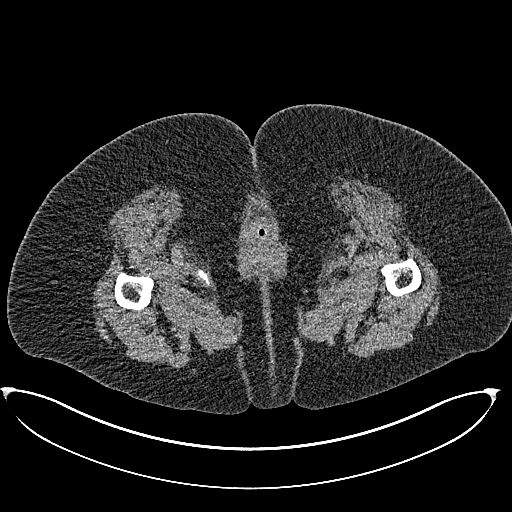
[im 36/353  bone]
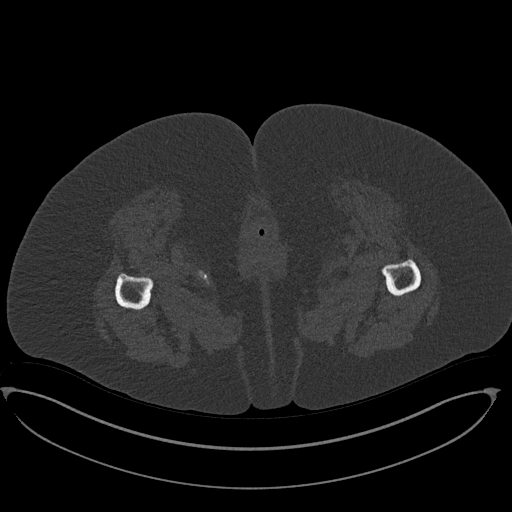
[im 71/353  soft-tissue]
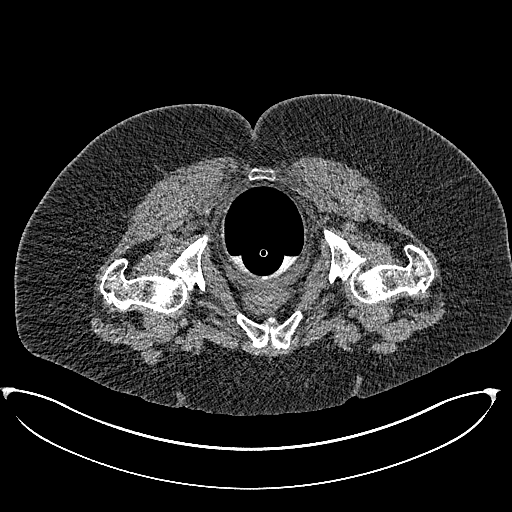
[im 106/353  soft-tissue]
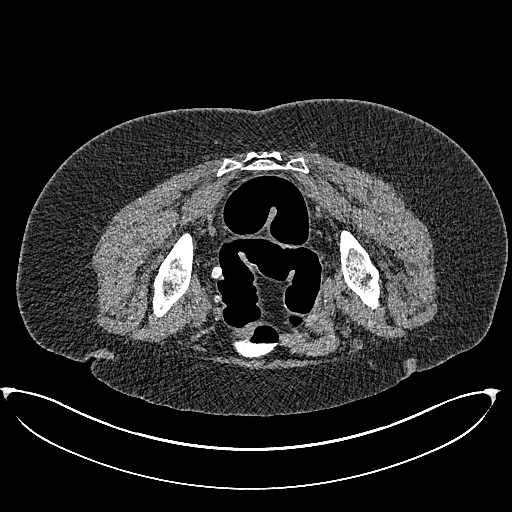
[im 141/353  soft-tissue]
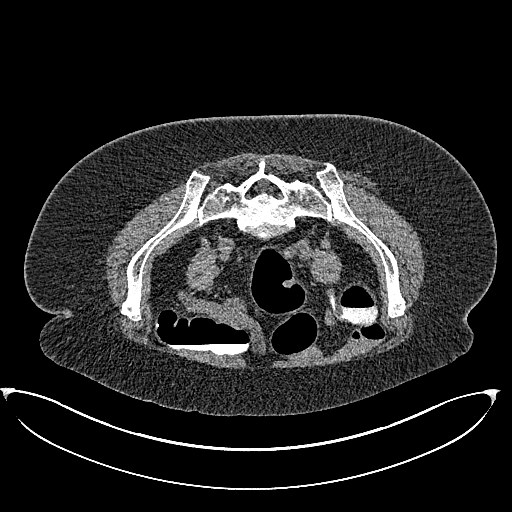
[im 177/353  soft-tissue]
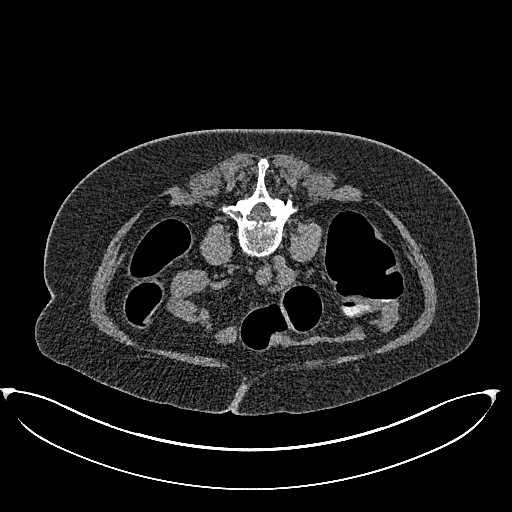
[im 212/353  soft-tissue]
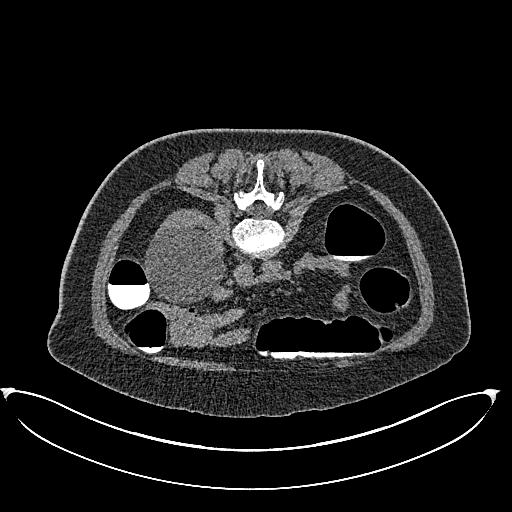
[im 247/353  soft-tissue]
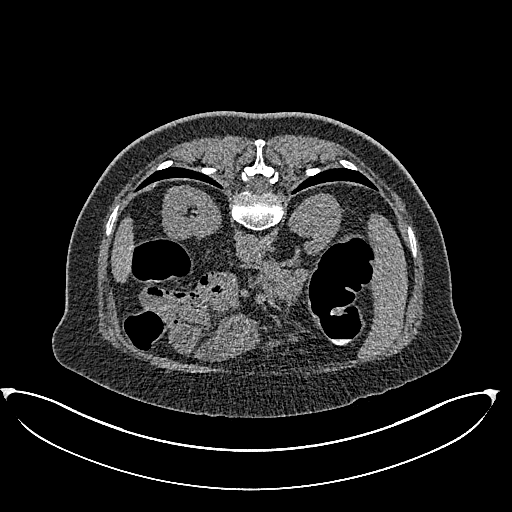
[im 282/353  soft-tissue]
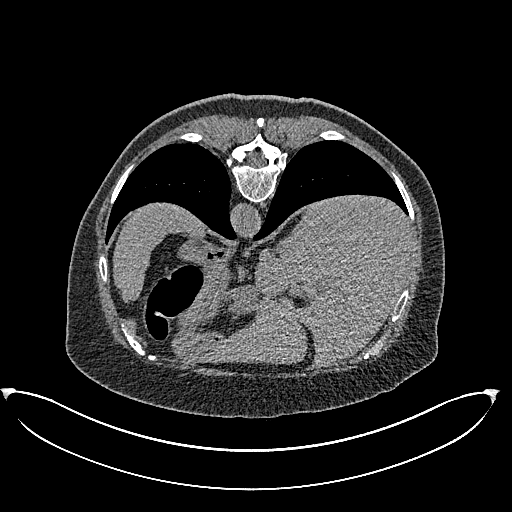
[im 317/353  soft-tissue]
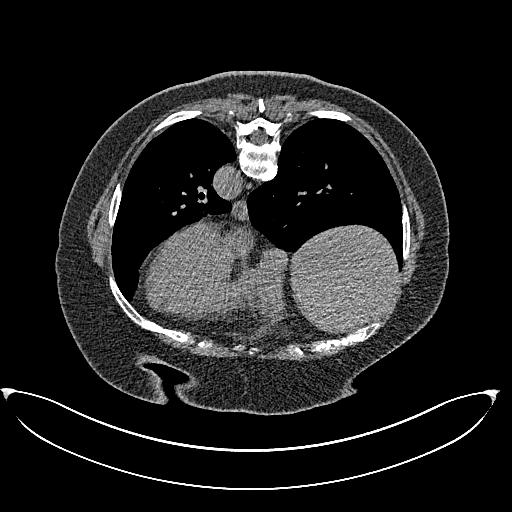
[im 317/353  bone]
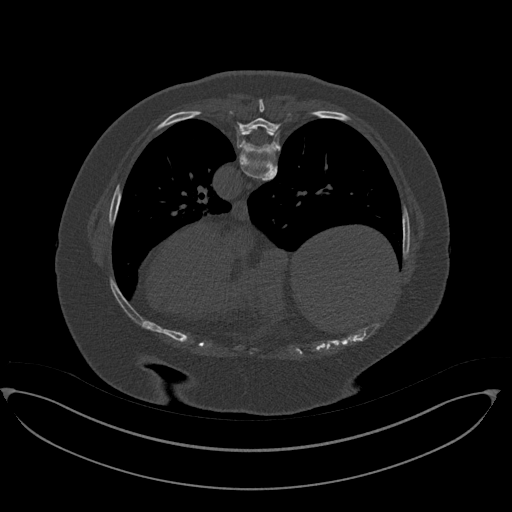

[15 of 46 positions shown; findings below may reference images not displayed]

FINDINGS: VIRTUAL COLONOSCOPY

Mild sigmoid underdistention on prone imaging. Otherwise, colonic
distension is good. There is a moderate amount of retained fluid. No
evidence of clinically significant colonic polyp or mass.

Virtual colonoscopy is not designed to detect diminutive polyps
(i.e., less than or equal to 5 mm), the presence or absence of which
may not affect clinical management.

CT ABDOMEN AND PELVIS WITHOUT CONTRAST

Lower chest: Clear lung bases. Normal heart size without pericardial
or pleural effusion.

Hepatobiliary: Normal liver. Cholecystectomy, without biliary ductal
dilatation.

Pancreas: Normal pancreas for age, without duct dilatation or acute
inflammation.

Spleen: Normal in size, without focal abnormality.

Adrenals/Urinary Tract: Normal adrenal glands. Interpolar 7.0 cm
left renal fluid density lesion is most consistent with a cyst.
Hyperattenuation within both renal collecting system, including on
55/5 bilaterally. Subtle hyperattenuation within a collapsed urinary
bladder including on 128/5.

Stomach/Bowel: Tiny hiatal hernia. Normal terminal ileum, appendix,
and small bowel.

Vascular/Lymphatic: Aortic atherosclerosis. No abdominopelvic
adenopathy.

Reproductive: Normal uterus and adnexa.

Other: No significant free fluid.  No free intraperitoneal air.

Musculoskeletal: No acute osseous abnormality. Lumbosacral
spondylosis.
IMPRESSION: 1. No evidence of clinically significant colonic polyp or mass.
2. Hyperattenuation in both renal collecting systems and the urinary
bladder. Question inadvertent administration of a small amount of
contrast versus possible absorption of enteric contrast. Recommend
correlation with urinalysis to exclude unlikely renal calculi.
3.  Aortic Atherosclerosis ([id number]).
4.  Tiny hiatal hernia.

## 2021-03-20 DIAGNOSIS — B370 Candidal stomatitis: Secondary | ICD-10-CM | POA: Diagnosis not present

## 2021-04-04 DIAGNOSIS — N816 Rectocele: Secondary | ICD-10-CM

## 2021-04-04 DIAGNOSIS — N8183 Incompetence or weakening of rectovaginal tissue: Secondary | ICD-10-CM

## 2021-04-04 DIAGNOSIS — N8110 Cystocele, unspecified: Secondary | ICD-10-CM | POA: Diagnosis not present

## 2021-04-04 DIAGNOSIS — K5904 Chronic idiopathic constipation: Secondary | ICD-10-CM | POA: Diagnosis not present

## 2021-04-04 HISTORY — DX: Rectocele: N81.6

## 2021-04-04 HISTORY — DX: Cystocele, unspecified: N81.10

## 2021-04-04 HISTORY — DX: Incompetence or weakening of rectovaginal tissue: N81.83

## 2021-04-22 DIAGNOSIS — H25813 Combined forms of age-related cataract, bilateral: Secondary | ICD-10-CM | POA: Diagnosis not present

## 2021-04-28 ENCOUNTER — Telehealth: Payer: Self-pay | Admitting: Cardiology

## 2021-04-28 ENCOUNTER — Telehealth: Payer: Self-pay

## 2021-04-28 MED ORDER — RANOLAZINE ER 500 MG PO TB12: ORAL_TABLET | ORAL | 3 refills | Status: DC

## 2021-04-28 NOTE — Telephone Encounter (Signed)
Refill sent to pharmacy.   

## 2021-04-28 NOTE — Telephone Encounter (Signed)
Pt c/o medication issue: 1. Name of Medication: ranolazine 500 mg 2. How are you currently taking this medication (dosage and times per day)? 2 times a day 3. Are you having a reaction (difficulty breathing--STAT)?  No  4. What is your medication issue? Patient calling stating that this medication is 134.00 and she need assistant. Patient also asking if she can take 1/2 and she has lost 45 pounds as well.

## 2021-04-29 NOTE — Telephone Encounter (Signed)
Spoke to the patient just now and she states that she does not want this mediation send to a [employer].   She states that she spoke with [facility] and she states that they were going to fax [place] over some paperwork so that she can get the medication for $10. I advised that I have not gotten these forms yet but will keep an eye out for it.

## 2021-04-29 NOTE — Telephone Encounter (Signed)
We received a fax at the [facility] office about Ranexa stating that it's on formulary and doesn't need a prior authorization, not sure if one had been submitted.  If copay is cost prohibitive with her insurance, would recommend she use a GoodRx coupon. Cheapest pricing seems to be through [facility] (1 month supply about $30). Would see if pt wishes rx to be sent in there. If so, pharmacy will need the following information to process GoodRx pricing:   BIN [id number] PCN [id number] [address] ID [id number]

## 2021-05-15 DIAGNOSIS — I25759 Atherosclerosis of native coronary artery of transplanted heart with unspecified angina pectoris: Secondary | ICD-10-CM | POA: Diagnosis not present

## 2021-05-15 DIAGNOSIS — D500 Iron deficiency anemia secondary to blood loss (chronic): Secondary | ICD-10-CM | POA: Diagnosis not present

## 2021-05-15 DIAGNOSIS — D497 Neoplasm of unspecified behavior of endocrine glands and other parts of nervous system: Secondary | ICD-10-CM | POA: Diagnosis not present

## 2021-05-15 DIAGNOSIS — Z0000 Encounter for general adult medical examination without abnormal findings: Secondary | ICD-10-CM | POA: Diagnosis not present

## 2021-05-15 DIAGNOSIS — Z79899 Other long term (current) drug therapy: Secondary | ICD-10-CM | POA: Diagnosis not present

## 2021-05-15 DIAGNOSIS — R7301 Impaired fasting glucose: Secondary | ICD-10-CM | POA: Diagnosis not present

## 2021-05-15 DIAGNOSIS — I10 Essential (primary) hypertension: Secondary | ICD-10-CM | POA: Diagnosis not present

## 2021-05-15 DIAGNOSIS — E785 Hyperlipidemia, unspecified: Secondary | ICD-10-CM | POA: Diagnosis not present

## 2021-06-02 DIAGNOSIS — Z1231 Encounter for screening mammogram for malignant neoplasm of breast: Secondary | ICD-10-CM | POA: Diagnosis not present

## 2021-06-13 ENCOUNTER — Other Ambulatory Visit: Payer: Self-pay | Admitting: Cardiology

## 2021-06-17 DIAGNOSIS — M25561 Pain in right knee: Secondary | ICD-10-CM | POA: Diagnosis not present

## 2021-06-17 DIAGNOSIS — M1711 Unilateral primary osteoarthritis, right knee: Secondary | ICD-10-CM | POA: Diagnosis not present

## 2021-08-12 ENCOUNTER — Other Ambulatory Visit: Payer: Self-pay

## 2021-08-12 DIAGNOSIS — I771 Stricture of artery: Secondary | ICD-10-CM

## 2021-08-24 ENCOUNTER — Other Ambulatory Visit: Payer: Self-pay | Admitting: Cardiology

## 2021-08-25 ENCOUNTER — Other Ambulatory Visit: Payer: Self-pay | Admitting: Cardiology

## 2021-08-27 ENCOUNTER — Ambulatory Visit (HOSPITAL_COMMUNITY)
Admission: RE | Admit: 2021-08-27 | Discharge: 2021-08-27 | Disposition: A | Discharge status: Home / Self Care | Payer: Medicare Other | Source: Ambulatory Visit | Attending: Vascular Surgery | Admitting: Vascular Surgery

## 2021-08-27 ENCOUNTER — Other Ambulatory Visit: Payer: Self-pay

## 2021-08-27 ENCOUNTER — Ambulatory Visit (INDEPENDENT_AMBULATORY_CARE_PROVIDER_SITE_OTHER): Payer: Medicare Other | Admitting: Physician Assistant

## 2021-08-27 ENCOUNTER — Encounter: Payer: Self-pay | Admitting: Physician Assistant

## 2021-08-27 VITALS — BP 143/83 | HR 66 | Temp 97.7°F | Resp 20 | Ht 63.0 in | Wt 172.0 lb

## 2021-08-27 DIAGNOSIS — I771 Stricture of artery: Secondary | ICD-10-CM

## 2021-08-27 DIAGNOSIS — K551 Chronic vascular disorders of intestine: Secondary | ICD-10-CM

## 2021-08-27 NOTE — Progress Notes (Signed)
Office Note     CC:  follow up Requesting Provider:  Street, Sharon Mt, *  HPI: Sara Ramos is a 75 y.o. 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  She notes no food fear or abdominal pain with eating. She does have recent weight loss of 15 lbs (45 lbs total over past year), however she has been intentionally trying to lose weight. She has changed her diet and feels this is helping a lot. She has been having trouble with food getting "stuck" in her throat. She has previously had to have an esophageal dilation. She explains she is scheduled to have this done again next month. She reports intermittent nausea. No changes in her bowels. She does have to take laxative but otherwise her bowels are working fine.  She has not had any renal dysfunction.  She remains on 3 medications for her blood pressure.  Patient cannot take aspirin due to severe intolerance she is on a statin drug  The pt is on a statin for cholesterol management.  The pt is not on a daily aspirin( does not tolerate)   Other AC:  none The pt is on BB, CCB, Maxzide for hypertension.   The pt is not diabetic.   Tobacco hx:  Former  Past Medical History:  Diagnosis Date   Aortic atherosclerosis ([place])    Benign essential hypertension    Dyslipidemia    GERD (gastroesophageal reflux disease)    Morbid obesity ([facility])    Primary osteoarthritis of knees, bilateral    Renal cyst, left     Past Surgical History:  Procedure Laterality Date   CHOLECYSTECTOMY  1991   KNEE SURGERY     TONSILLECTOMY      Social History   Socioeconomic History   Marital status: Married    Spouse name: Not on file   Number of children: Not on file   Years of education: Not on file   Highest education level: Not on file  Occupational History   Not on file  Tobacco Use   Smoking status: Former   Smokeless tobacco: Never  Substance and Sexual Activity   Alcohol  use: Never   Drug use: Not on file   Sexual activity: Not on file  Other Topics Concern   Not on file  Social History Narrative   Not on file   Social Determinants of Health   Financial Resource Strain: Not on file  Food Insecurity: Not on file  Transportation Needs: Not on file  Physical Activity: Not on file  Stress: Not on file  Social Connections: Not on file  Intimate Partner Violence: Not on file    Family History  Problem Relation Age of Onset   Colon cancer Mother    CAD Mother    Diabetes Mother    Peripheral vascular disease Father     Current Outpatient Medications  Medication Sig Dispense Refill   carvedilol (COREG) 12.5 MG tablet TAKE 1 TABLET(12.5 MG) BY MOUTH TWICE DAILY 180 tablet 3   diphenhydrAMINE HCl (BENADRYL ALLERGY PO) Take by mouth at bedtime.     Melatonin 1 MG CAPS Take by mouth at bedtime.     omeprazole (PRILOSEC) 20 MG capsule Take 20 mg by mouth daily.     ranolazine (RANEXA) 500 MG 12 hr tablet Pt needs appointment for further refills. 1 st attempt 180 tablet 0   triamterene-hydrochlorothiazide (MAXZIDE-25) 37.5-25 MG tablet Take 1 tablet  by mouth daily.     amLODipine (NORVASC) 5 MG tablet Take 5 mg by mouth daily.     atorvastatin (LIPITOR) 40 MG tablet TAKE 1 TABLET(40 MG) BY MOUTH DAILY (Patient not taking: Reported on 08/27/2021) 90 tablet 3   No current facility-administered medications for this visit.    Allergies  Allergen Reactions   Amoxil [Amoxicillin]    Aspirin    Cipro [Ciprofloxacin In D5w]    Dexamethasone    Dyclonine-Fd&C Blue #1-Fd&C Red #40-Menth [Dyclonine Hcl]     Yellow #5   Gabapentin    Latex    Niacin And Related    Nitroglycerin    Penicillins    Prednisone    Proxyphylline    Septra [Sulfamethoxazole-Trimethoprim]    Vioxx [Rofecoxib]      REVIEW OF SYSTEMS:   [X]  denotes positive finding, [ ]  denotes negative finding Cardiac  Comments:  Chest pain or chest pressure:    Shortness of breath  upon exertion:    Short of breath when lying flat:    Irregular heart rhythm:        Vascular    Pain in calf, thigh, or hip brought on by ambulation:    Pain in feet at night that wakes you up from your sleep:     Blood clot in your veins:    Leg swelling:         Pulmonary    Oxygen at home:    Productive cough:     Wheezing:         Neurologic    Sudden weakness in arms or legs:     Sudden numbness in arms or legs:     Sudden onset of difficulty speaking or slurred speech:    Temporary loss of vision in one eye:     Problems with dizziness:         Gastrointestinal    Blood in stool:     Vomited blood:         Genitourinary    Burning when urinating:     Blood in urine:        Psychiatric    Major depression:         Hematologic    Bleeding problems:    Problems with blood clotting too easily:        Skin    Rashes or ulcers:        Constitutional    Fever or chills:      PHYSICAL EXAMINATION:  Vitals:   08/27/21 0839  BP: (!) 143/83  Pulse: 66  Resp: 20  Temp: 97.7 F (36.5 C)  TempSrc: Oral  SpO2: 96%  Weight: 172 lb (78 kg)  Height: 5\' 3"  (1.6 m)    General:  WDWN in NAD; vital signs documented above Gait: Normal HENT: WNL, normocephalic Pulmonary: normal non-labored breathing , without wheezing Cardiac: regular HR, without  Murmurs without carotid bruit Abdomen: obese, soft, NT, no masses Vascular Exam/Pulses:  Right Left  Radial 2+ (normal) 2+ (normal)   Femoral 2+ (normal) 2+ (normal)  Popliteal 2+ (normal) 2+ (normal)  DP 2+ (normal) 2+ (normal)  PT 1+ (weak) 1+ (weak)   Extremities: without ischemic changes, without Gangrene , without cellulitis; without open wounds;  Musculoskeletal: no muscle wasting or atrophy  Neurologic: A&O X 3;  No focal weakness or paresthesias are detected Psychiatric:  The pt has Normal affect.   Non-Invasive Vascular Imaging:   Duplex Findings:   +----------------------+--------+--------+------+--------+  Mesenteric            PSV cm/sEDV cm/sPlaqueComments  +----------------------+--------+--------+------+--------+  Aorta Prox               37                           +----------------------+--------+--------+------+--------+  Celiac Artery Origin    139                           +----------------------+--------+--------+------+--------+  Celiac Artery Proximal  135                           +----------------------+--------+--------+------+--------+  SMA Origin              164                           +----------------------+--------+--------+------+--------+  SMA Proximal            173                           +----------------------+--------+--------+------+--------+  SMA Mid                 138                           +----------------------+--------+--------+------+--------+  SMA Distal              107                           +----------------------+--------+--------+------+--------+    Summary:  Mesenteric:  Normal Celiac artery and Superior Mesenteric artery findings.   ASSESSMENT/PLAN:: 75 y.o. female here for follow up for SMA stenosis. She is doing well without any associated abdominal symptoms. Continue conservative management and routine follow ups. Per Dr. Jamse Mead last office note, we can follow this every 2 years as her studies are stable - Continue Statin - She remains currently managed on 3 antihypertensive medications - Will repeat her renal artery duplex as well at that time -She will follow up in 2 years with repeat Mesenteric duplex - she knows should she have any new or concerning symptoms to call for earlier follow up   Karoline Caldwell, PA-C [facility] [phone]  Clinic MD:   Yetta Barre

## 2021-08-28 DIAGNOSIS — Z23 Encounter for immunization: Secondary | ICD-10-CM | POA: Diagnosis not present

## 2021-09-08 DIAGNOSIS — H9190 Unspecified hearing loss, unspecified ear: Secondary | ICD-10-CM | POA: Diagnosis not present

## 2021-09-08 DIAGNOSIS — H8110 Benign paroxysmal vertigo, unspecified ear: Secondary | ICD-10-CM | POA: Diagnosis not present

## 2021-09-08 DIAGNOSIS — H9312 Tinnitus, left ear: Secondary | ICD-10-CM | POA: Diagnosis not present

## 2021-09-08 DIAGNOSIS — R42 Dizziness and giddiness: Secondary | ICD-10-CM | POA: Diagnosis not present

## 2021-10-07 ENCOUNTER — Ambulatory Visit (INDEPENDENT_AMBULATORY_CARE_PROVIDER_SITE_OTHER): Payer: Medicare Other | Admitting: Sports Medicine

## 2021-10-07 ENCOUNTER — Ambulatory Visit (INDEPENDENT_AMBULATORY_CARE_PROVIDER_SITE_OTHER): Payer: Medicare Other

## 2021-10-07 ENCOUNTER — Other Ambulatory Visit: Payer: Self-pay

## 2021-10-07 ENCOUNTER — Encounter: Payer: Self-pay | Admitting: Sports Medicine

## 2021-10-07 DIAGNOSIS — M778 Other enthesopathies, not elsewhere classified: Secondary | ICD-10-CM

## 2021-10-07 DIAGNOSIS — G629 Polyneuropathy, unspecified: Secondary | ICD-10-CM

## 2021-10-07 DIAGNOSIS — M79671 Pain in right foot: Secondary | ICD-10-CM

## 2021-10-07 DIAGNOSIS — G588 Other specified mononeuropathies: Secondary | ICD-10-CM

## 2021-10-07 DIAGNOSIS — M79672 Pain in left foot: Secondary | ICD-10-CM

## 2021-10-07 DIAGNOSIS — M779 Enthesopathy, unspecified: Secondary | ICD-10-CM | POA: Diagnosis not present

## 2021-10-07 MED ORDER — TRIAMCINOLONE ACETONIDE 10 MG/ML IJ SUSP
10.0000 mg | Freq: Once | INTRAMUSCULAR | Status: AC
  Administered 2021-10-07: 10 mg

## 2021-10-07 MED ORDER — GABAPENTIN 300 MG PO CAPS: 300.0000 mg | ORAL_CAPSULE | Freq: Every day | ORAL | 3 refills | Status: DC

## 2021-10-07 NOTE — Progress Notes (Signed)
Subjective: Sara Ramos is a 75 y.o. female patient who presents to office for evaluation of right and left foot pain.  Patient reports that it feels like she is walking on marshmallows at the balls of both feet for the last 2 months states that she has had 2 episodes of pain that was really bad that woke her up at night and states that sometimes it is also worse when she is barefooted pain can be as bad as 8 out of 10 has not tried any treatment.   Patient does admit to a history of sciatica and low back problems with chronic disc problems for the last 30 years.  No other pedal complaints noted.  Patient denies a history of diabetes.  Patient Active Problem List   Diagnosis Date Noted   Cystocele with rectocele 04/04/2021   Rectocele 04/04/2021   Weakening of rectovaginal tissue 04/04/2021   Benign essential hypertension    Dyslipidemia    Aortic atherosclerosis (HCC)    GERD (gastroesophageal reflux disease)    Primary osteoarthritis of knees, bilateral    Renal cyst, left    Coronary artery disease involving native coronary artery of native heart without angina pectoris 07/05/2020   Essential hypertension 07/05/2020   Mixed hyperlipidemia 07/05/2020   Stenosis of celiac artery ([place]) 07/05/2020   Obesity (BMI 30-39.9) 07/05/2020    Current Outpatient Medications on File Prior to Visit  Medication Sig Dispense Refill   meclizine (ANTIVERT) 25 MG tablet Take 25 mg by mouth 3 (three) times daily as needed for dizziness.     atorvastatin (LIPITOR) 40 MG tablet TAKE 1 TABLET(40 MG) BY MOUTH DAILY (Patient not taking: Reported on 08/27/2021) 90 tablet 3   carvedilol (COREG) 12.5 MG tablet TAKE 1 TABLET(12.5 MG) BY MOUTH TWICE DAILY 180 tablet 3   diphenhydrAMINE HCl (BENADRYL ALLERGY PO) Take by mouth at bedtime.     Melatonin 1 MG CAPS Take by mouth at bedtime.     ranolazine (RANEXA) 500 MG 12 hr tablet Pt needs appointment for further refills. 1 st attempt 180 tablet 0    triamterene-hydrochlorothiazide (MAXZIDE-25) 37.5-25 MG tablet Take 1 tablet by mouth daily.     No current facility-administered medications on file prior to visit.    Allergies  Allergen Reactions   Amoxil [Amoxicillin]    Aspirin    Cipro [Ciprofloxacin In D5w]    Dexamethasone    Dyclonine-Fd&C Blue #1-Fd&C Red #40-Menth [Dyclonine Hcl]     Yellow #5   Gabapentin    Latex    Niacin And Related    Nitroglycerin    Other Other (See Comments)   Penicillins    Prednisone    Proxyphylline    Septra [Sulfamethoxazole-Trimethoprim]    Vioxx [Rofecoxib]     Objective:  General: Alert and oriented x3 in no acute distress  Dermatology: No open lesions bilateral lower extremities, no webspace macerations, no ecchymosis bilateral, all nails x 10 are well manicured.  Vascular: Dorsalis Pedis and Posterior Tibial pedal pulses palpable, Capillary Fill Time 3 seconds,(+) pedal hair growth bilateral, no edema bilateral lower extremities, Temperature gradient within normal limits.  Neurology: Johney Maine sensation intact via light touch bilateral, subjective awkward sensations to the balls of both feet feels like she is walking on marshmallows bilateral.  Musculoskeletal: Mild tenderness with palpation diffusely to the balls of both feet with most pain noted at the third webspace bilateral and second metatarsophalangeal joints bilateral right slightly more symptomatic than left.  Gait: Mildly antalgic  gait  Xrays  Right and left foot   Impression: No acute osseous findings at the forefoot however at the rear foot there is very minimal calcaneal heel spurs posterior greater than inferior noted right greater than left.  Assessment and Plan: Problem List Items Addressed This Visit   None Visit Diagnoses     Neuropathy    -  Primary   Relevant Orders   DG Foot Complete Right   DG Foot Complete Left   Neuroma digital nerve       Bilateral foot pain       Capsulitis of right foot        Capsulitis of left foot            -Complete examination performed -Xrays reviewed -Discussed treatement options for likely neuroma versus capsulitis versus neuropathy secondary to radiculopathy -After oral consent and aseptic prep, injected a mixture containing 1 ml of 2% plain lidocaine, 1 ml 0.5% plain marcaine, 0.5 ml of kenalog 10 and 0.5 ml of dexamethasone phosphate into area of maximal pain at the third webspace on right and left foot without complication.  Patient denies any known adverse reactions states that she thinks it was from a previous shot but was not sure because she was taking many multiple meds at that time and had previous shots in her back and has not had a reaction.  Post-injection care discussed with patient -Prescribed gabapentin for patient to take at bedtime patient denies any current reaction and states that she even tried one of her husband's gabapentin and did not have any reaction to it so does not think she has a true allergy -Recommend good supportive shoes daily -Patient to return to office as scheduled in 3 to 4 weeks or sooner if condition worsens.  Landis Martins, DPM

## 2021-10-08 ENCOUNTER — Other Ambulatory Visit: Payer: Self-pay | Admitting: Sports Medicine

## 2021-10-08 DIAGNOSIS — B9689 Other specified bacterial agents as the cause of diseases classified elsewhere: Secondary | ICD-10-CM | POA: Diagnosis not present

## 2021-10-08 DIAGNOSIS — M779 Enthesopathy, unspecified: Secondary | ICD-10-CM

## 2021-10-08 DIAGNOSIS — J019 Acute sinusitis, unspecified: Secondary | ICD-10-CM | POA: Diagnosis not present

## 2021-10-08 DIAGNOSIS — G629 Polyneuropathy, unspecified: Secondary | ICD-10-CM

## 2021-10-08 DIAGNOSIS — Z20828 Contact with and (suspected) exposure to other viral communicable diseases: Secondary | ICD-10-CM | POA: Diagnosis not present

## 2021-10-14 DIAGNOSIS — R1310 Dysphagia, unspecified: Secondary | ICD-10-CM | POA: Diagnosis not present

## 2021-10-14 DIAGNOSIS — K589 Irritable bowel syndrome without diarrhea: Secondary | ICD-10-CM | POA: Diagnosis not present

## 2021-10-20 DIAGNOSIS — J028 Acute pharyngitis due to other specified organisms: Secondary | ICD-10-CM | POA: Diagnosis not present

## 2021-10-20 DIAGNOSIS — B9689 Other specified bacterial agents as the cause of diseases classified elsewhere: Secondary | ICD-10-CM | POA: Diagnosis not present

## 2021-10-20 DIAGNOSIS — Z20828 Contact with and (suspected) exposure to other viral communicable diseases: Secondary | ICD-10-CM | POA: Diagnosis not present

## 2021-11-12 ENCOUNTER — Encounter: Payer: Self-pay | Admitting: Sports Medicine

## 2021-11-12 ENCOUNTER — Ambulatory Visit (INDEPENDENT_AMBULATORY_CARE_PROVIDER_SITE_OTHER): Payer: Medicare Other | Admitting: Sports Medicine

## 2021-11-12 ENCOUNTER — Other Ambulatory Visit: Payer: Self-pay

## 2021-11-12 DIAGNOSIS — M79671 Pain in right foot: Secondary | ICD-10-CM

## 2021-11-12 DIAGNOSIS — M79672 Pain in left foot: Secondary | ICD-10-CM

## 2021-11-12 DIAGNOSIS — G629 Polyneuropathy, unspecified: Secondary | ICD-10-CM

## 2021-11-12 DIAGNOSIS — G588 Other specified mononeuropathies: Secondary | ICD-10-CM

## 2021-11-12 MED ORDER — GABAPENTIN 300 MG PO CAPS
300.0000 mg | ORAL_CAPSULE | Freq: Every day | ORAL | 3 refills | Status: DC
Start: 2021-11-12 — End: 2023-11-26

## 2021-11-12 NOTE — Progress Notes (Signed)
Subjective: Sara Ramos is a 76 y.o. female patient who returns to office for  follow up evaluation of right and left foot pain.  Patient reports that she feels better and that the Shot and Gabapentin has helped. Feels much better. Still has some puffiness or awkward sensation to balls a little in the morning.   No other issues noted.   Patient Active Problem List   Diagnosis Date Noted   Cystocele with rectocele 04/04/2021   Rectocele 04/04/2021   Weakening of rectovaginal tissue 04/04/2021   Benign essential hypertension    Dyslipidemia    Aortic atherosclerosis (HCC)    GERD (gastroesophageal reflux disease)    Primary osteoarthritis of knees, bilateral    Renal cyst, left    Coronary artery disease involving native coronary artery of native heart without angina pectoris 07/05/2020   Essential hypertension 07/05/2020   Mixed hyperlipidemia 07/05/2020   Stenosis of celiac artery ([place]) 07/05/2020   Obesity (BMI 30-39.9) 07/05/2020    Current Outpatient Medications on File Prior to Visit  Medication Sig Dispense Refill   atorvastatin (LIPITOR) 40 MG tablet TAKE 1 TABLET(40 MG) BY MOUTH DAILY (Patient not taking: Reported on 08/27/2021) 90 tablet 3   carvedilol (COREG) 12.5 MG tablet TAKE 1 TABLET(12.5 MG) BY MOUTH TWICE DAILY 180 tablet 3   diphenhydrAMINE HCl (BENADRYL ALLERGY PO) Take by mouth at bedtime.     meclizine (ANTIVERT) 25 MG tablet Take 25 mg by mouth 3 (three) times daily as needed for dizziness.     Melatonin 1 MG CAPS Take by mouth at bedtime.     ranolazine (RANEXA) 500 MG 12 hr tablet Pt needs appointment for further refills. 1 st attempt 180 tablet 0   triamterene-hydrochlorothiazide (MAXZIDE-25) 37.5-25 MG tablet Take 1 tablet by mouth daily.     No current facility-administered medications on file prior to visit.    Allergies  Allergen Reactions   Amoxil [Amoxicillin]    Aspirin    Cipro [Ciprofloxacin In D5w]    Dexamethasone    Dyclonine-Fd&C Blue  #1-Fd&C Red #40-Menth [Dyclonine Hcl]     Yellow #5   Gabapentin    Latex    Niacin And Related    Nitroglycerin    Other Other (See Comments)   Penicillins    Prednisone    Proxyphylline    Septra [Sulfamethoxazole-Trimethoprim]    Vioxx [Rofecoxib]     Objective:  General: Alert and oriented x3 in no acute distress  Dermatology: No open lesions bilateral lower extremities, no webspace macerations, no ecchymosis bilateral, all nails x 10 are well manicured.  Vascular: Dorsalis Pedis and Posterior Tibial pedal pulses palpable, Capillary Fill Time 3 seconds,(+) pedal hair growth bilateral, no edema bilateral lower extremities, Temperature gradient within normal limits.  Neurology: Johney Maine sensation intact via light touch bilateral, subjective awkward sensations to the balls of both feet feels like she is walking on marshmallows bilateral.  Musculoskeletal: No reproducible tenderness with palpation diffusely to the balls of both feet.   Assessment and Plan: Problem List Items Addressed This Visit   None Visit Diagnoses     Neuropathy    -  Primary   Neuroma digital nerve       Bilateral foot pain            -Complete examination performed -Pain is much improved -Recommend continue Gabapentin -Recommend good supportive shoes daily -Recommend massage of balls of feet -Return PRN  Landis Martins, DPM

## 2021-11-19 DIAGNOSIS — R1310 Dysphagia, unspecified: Secondary | ICD-10-CM | POA: Diagnosis not present

## 2021-11-19 DIAGNOSIS — K2289 Other specified disease of esophagus: Secondary | ICD-10-CM | POA: Diagnosis not present

## 2021-11-19 DIAGNOSIS — B3781 Candidal esophagitis: Secondary | ICD-10-CM | POA: Diagnosis not present

## 2021-11-20 ENCOUNTER — Other Ambulatory Visit: Payer: Self-pay | Admitting: Cardiology

## 2022-01-05 DIAGNOSIS — Z20822 Contact with and (suspected) exposure to covid-19: Secondary | ICD-10-CM | POA: Diagnosis not present

## 2022-01-08 DIAGNOSIS — Z20828 Contact with and (suspected) exposure to other viral communicable diseases: Secondary | ICD-10-CM | POA: Diagnosis not present

## 2022-01-08 DIAGNOSIS — Z20822 Contact with and (suspected) exposure to covid-19: Secondary | ICD-10-CM | POA: Diagnosis not present

## 2022-02-20 ENCOUNTER — Other Ambulatory Visit: Payer: Self-pay | Admitting: Cardiology

## 2022-02-23 ENCOUNTER — Other Ambulatory Visit: Payer: Self-pay | Admitting: Cardiology

## 2022-02-24 ENCOUNTER — Other Ambulatory Visit: Payer: Self-pay | Admitting: Cardiology

## 2022-03-02 NOTE — Progress Notes (Signed)
?Cardiology Office Note:   ? ?Date:  03/03/2022  ? ?ID:  Sara Ramos, DOB December 17, 1945, MRN [id number] ? ?PCP:  Street, Sharon Mt, MD  ?Cardiologist:  Shirlee More, MD   ? ?Referring MD: Street, Sharon Mt, *  ? ? ?ASSESSMENT:   ? ?1. Coronary artery disease involving native coronary artery of native heart without angina pectoris   ?2. Essential hypertension   ?3. Mixed hyperlipidemia   ? ?PLAN:   ? ?In order of problems listed above: ? ?She continues to do well with CAD having no anginal discomfort we will continue medical treatment we will start low-dose aspirin coated continue beta-blocker statin and Ranexa.  She does not require repeat ischemia evaluation. ?I am unsure if her blood pressure is well controlled there is a great deal of variability on office measurements and she will check her blood pressure at home and if she is consistently greater than 140 contact me or her PCP.  If that is the case a calcium channel blocker would be appropriate like amlodipine. ?Continue her statin she has arrangements for follow-up labs this summer with her PCP ? ? ?Next appointment: 1 year ? ? ?Medication Adjustments/Labs and Tests Ordered: ?Current medicines are reviewed at length with the patient today.  Concerns regarding medicines are outlined above.  ?No orders of the defined types were placed in this encounter. ? ?No orders of the defined types were placed in this encounter. ? ? ?Chief Complaint  ?Patient presents with  ? Follow-up  ? Coronary Artery Disease  ? ? ?History of Present Illness:   ? ?Sara Ramos is a 76 y.o. female with a hx of hypertension hyperlipidemia CAD last seen 10/09/2020.  At her last visit she related an ongoing  chest pain pattern intolerant of nitrates with headache and her last office note said that she had responded well to ranolazine.  . ? ?Compliance with diet, lifestyle and medications: Yes ? ?She continues to do well she is here today because she needs refill of prescription . ?She  has no anginal discomfort or side effects from medication ?She is not on aspirin I reviewed her history she said she choked once with on coated aspirin but subsequently has taken coated aspirin without any side effect. ?She does not check her blood pressure at home ?She tolerates her statin without muscle pain or weakness ?She has had no edema shortness of breath chest pain palpitation or syncope ? ?Cardiac CTA performed 07/02/2020 shows a calcium score of 75/66 percentile minimal CAD was present with less than 24% plaque right coronary artery and LAD. ?Past Medical History:  ?Diagnosis Date  ? Aortic atherosclerosis ([place])   ? Benign essential hypertension   ? Dyslipidemia   ? GERD (gastroesophageal reflux disease)   ? Morbid obesity ([place])   ? Primary osteoarthritis of knees, bilateral   ? Renal cyst, left   ? ? ?Past Surgical History:  ?Procedure Laterality Date  ? CHOLECYSTECTOMY  1991  ? KNEE SURGERY    ? TONSILLECTOMY    ? ? ?Current Medications: ?Current Meds  ?Medication Sig  ? atorvastatin (LIPITOR) 40 MG tablet TAKE 1 TABLET(40 MG) BY MOUTH DAILY  ? carvedilol (COREG) 12.5 MG tablet TAKE 1 TABLET(12.5 MG) BY MOUTH TWICE DAILY  ? diphenhydrAMINE HCl (BENADRYL ALLERGY PO) Take 1 tablet by mouth daily as needed (Allergies).  ? gabapentin (NEURONTIN) 300 MG capsule Take 1 capsule (300 mg total) by mouth at bedtime.  ? meclizine (ANTIVERT) 25 MG tablet Take  25 mg by mouth 3 (three) times daily as needed for dizziness.  ? Melatonin 1 MG CAPS Take by mouth at bedtime.  ? ranolazine (RANEXA) 500 MG 12 hr tablet TAKE 1 TABLET BY MOUTH TWICE DAILY. Schedule an appointment for further refills, 1st attempt  ? triamterene-hydrochlorothiazide (MAXZIDE-25) 37.5-25 MG tablet Take 1 tablet by mouth daily.  ?  ? ?Allergies:   Aspirin, Cipro [ciprofloxacin in d5w], Dexamethasone, Dyclonine-fd&c blue #1-fd&c red #40-menth [dyclonine hcl], Niacin and related, Nitroglycerin, Prednisone, Proxyphylline, Septra  [sulfamethoxazole-trimethoprim], and Vioxx [rofecoxib]  ? ?Social History  ? ?Socioeconomic History  ? Marital status: Married  ?  Spouse name: Not on file  ? Number of children: Not on file  ? Years of education: Not on file  ? Highest education level: Not on file  ?Occupational History  ? Not on file  ?Tobacco Use  ? Smoking status: Former  ?  Passive exposure: Past  ? Smokeless tobacco: Never  ?Vaping Use  ? Vaping Use: Never used  ?Substance and Sexual Activity  ? Alcohol use: Never  ? Drug use: Never  ? Sexual activity: Not on file  ?Other Topics Concern  ? Not on file  ?Social History Narrative  ? Not on file  ? ?Social Determinants of Health  ? ?Financial Resource Strain: Not on file  ?Food Insecurity: Not on file  ?Transportation Needs: Not on file  ?Physical Activity: Not on file  ?Stress: Not on file  ?Social Connections: Not on file  ?  ? ?Family History: ?The patient's family history includes CAD in her mother; Colon cancer in her mother; Diabetes in her mother; Peripheral vascular disease in her father. ?ROS:   ?Please see the history of present illness.    ?All other systems reviewed and are negative. ? ?EKGs/Labs/Other Studies Reviewed:   ? ?The following studies were reviewed today: ? ?EKG:  EKG ordered today and personally reviewed.  The ekg ordered today demonstrates sinus rhythm poor R wave progression otherwise normal ? ?Recent Labs: ?05/15/2021 cholesterol 161 triglycerides 174 hemoglobin 13.2 creatinine 0.7 A1c 5.0% ? ?Physical Exam:   ? ?VS:  BP (!) 158/84 (BP Location: Right Arm)   Pulse 62   Ht '5\' 3"'$  (1.6 m)   Wt 181 lb (82.1 kg)   SpO2 98%   BMI 32.06 kg/m?    ? ?Wt Readings from Last 3 Encounters:  ?03/03/22 181 lb (82.1 kg)  ?08/27/21 172 lb (78 kg)  ?10/09/20 187 lb 12.8 oz (85.2 kg)  ?  ? ?GEN:  Well nourished, well developed in no acute distress ?HEENT: Normal ?NECK: No JVD; No carotid bruits ?LYMPHATICS: No lymphadenopathy ?CARDIAC: RRR, no murmurs, rubs, gallops ?RESPIRATORY:   Clear to auscultation without rales, wheezing or rhonchi  ?ABDOMEN: Soft, non-tender, non-distended ?MUSCULOSKELETAL:  No edema; No deformity  ?SKIN: Warm and dry ?NEUROLOGIC:  Alert and oriented x 3 ?PSYCHIATRIC:  Normal affect  ? ? ?Signed, ?Shirlee More, MD  ?03/03/2022 9:17 AM    ?[place]  ?

## 2022-03-03 ENCOUNTER — Ambulatory Visit (INDEPENDENT_AMBULATORY_CARE_PROVIDER_SITE_OTHER): Payer: Medicare Other | Admitting: Cardiology

## 2022-03-03 ENCOUNTER — Encounter: Payer: Self-pay | Admitting: Cardiology

## 2022-03-03 VITALS — BP 158/84 | HR 62 | Ht 63.0 in | Wt 181.0 lb

## 2022-03-03 DIAGNOSIS — I10 Essential (primary) hypertension: Secondary | ICD-10-CM | POA: Diagnosis not present

## 2022-03-03 DIAGNOSIS — I2510 Atherosclerotic heart disease of native coronary artery without angina pectoris: Secondary | ICD-10-CM

## 2022-03-03 DIAGNOSIS — E782 Mixed hyperlipidemia: Secondary | ICD-10-CM | POA: Diagnosis not present

## 2022-03-03 MED ORDER — ASPIRIN EC 81 MG PO TBEC: 81.0000 mg | DELAYED_RELEASE_TABLET | Freq: Every day | ORAL | 3 refills | Status: DC

## 2022-03-03 NOTE — Patient Instructions (Signed)
Medication Instructions:  ?Your physician has recommended you make the following change in your medication:  ? ?START: Aspirin 81 mg daily ? ?*If you need a refill on your cardiac medications before your next appointment, please call your pharmacy* ? ? ?Lab Work: ?None ?If you have labs (blood work) drawn today and your tests are completely normal, you will receive your results only by: ?MyChart Message (if you have MyChart) OR ?A paper copy in the mail ?If you have any lab test that is abnormal or we need to change your treatment, we will call you to review the results. ? ? ?Testing/Procedures: ?None ? ? ?Follow-Up: ?At [facility], you and your health needs are our priority.  As part of our continuing mission to provide you with exceptional heart care, we have created designated Provider Care Teams.  These Care Teams include your primary Cardiologist (physician) and Advanced Practice Providers (APPs -  Physician Assistants and Nurse Practitioners) who all work together to provide you with the care you need, when you need it. ? ?We recommend signing up for the patient portal called "MyChart".  Sign up information is provided on this After Visit Summary.  MyChart is used to connect with patients for Virtual Visits (Telemedicine).  Patients are able to view lab/test results, encounter notes, upcoming appointments, etc.  Non-urgent messages can be sent to your provider as well.   ?To learn more about what you can do with MyChart, go to [email or web address].   ? ?Your next appointment:   ?1 year(s) ? ?The format for your next appointment:   ?In Person ? ?Provider:   ?Shirlee More, MD  ? ? ?Other Instructions ?Check home blood pressures ? ?Important Information About Sugar ? ? ? ? ? ? ? ? ? ?[employer]  ?Tips to measure your blood pressure correctly ? ?To determine whether you have hypertension, a medical professional will take a blood pressure reading. How you prepare for the test, the position of your arm, and  other factors can change a blood pressure reading by 10% or more. That could be enough to hide high blood pressure, start you on a drug you don't really need, or lead your doctor to incorrectly adjust your medications. ?National and international guidelines offer specific instructions for measuring blood pressure. If a doctor, nurse, or medical assistant isn't doing it right, don't hesitate to ask him or her to get with the guidelines. ?Here's what you can do to ensure a correct reading: ? Don't drink a caffeinated beverage or smoke during the 30 minutes before the test. ? Sit quietly for five minutes before the test begins. ? During the measurement, sit in a chair with your feet on the floor and your arm supported so your elbow is at about heart level. ? The inflatable part of the cuff should completely cover at least 80% of your upper arm, and the cuff should be placed on bare skin, not over a shirt. ? Don't talk during the measurement. ? Have your blood pressure measured twice, with a brief break in between. If the readings are different by 5 points or more, have it done a third time. ?There are times to break these rules. If you sometimes feel lightheaded when getting out of bed in the morning or when you stand after sitting, you should have your blood pressure checked while seated and then while standing to see if it falls from one position to the next. ?Because blood pressure varies throughout the day, your doctor will  rarely diagnose hypertension on the basis of a single reading. Instead, he or she will want to confirm the measurements on at least two occasions, usually within a few weeks of one another. The exception to this rule is if you have a blood pressure reading of 180/110 mm Hg or higher. A result this high usually calls for prompt treatment. ?It's also a good idea to have your blood pressure measured in both arms at least once, since the reading in one arm (usually the right) may be higher than that  in the left. A 2014 study in The American Journal of Medicine of nearly 3,400 people found average arm- to-arm differences in systolic blood pressure of about 5 points. The higher number should be used to make treatment decisions. ?In 2017, new guidelines from the [employer], the [employer] of Cardiology, and nine other health organizations lowered the diagnosis of high blood pressure to 130/80 mm Hg or higher for all adults. The guidelines also redefined the various blood pressure categories to now include normal, elevated, Stage 1 hypertension, Stage 2 hypertension, and hypertensive crisis (see "Blood pressure categories"). ?Blood pressure categories  ?Blood pressure category SYSTOLIC ?(upper number)  DIASTOLIC ?(lower number)  ?Normal Less than 120 mm Hg and Less than 80 mm Hg  ?Elevated 120-129 mm Hg and Less than 80 mm Hg  ?High blood pressure: Stage 1 hypertension 130-139 mm Hg or 80-89 mm Hg  ?High blood pressure: Stage 2 hypertension 140 mm Hg or higher or 90 mm Hg or higher  ?Hypertensive crisis (consult your doctor immediately) Higher than 180 mm Hg and/or Higher than 120 mm Hg  ?Source: American Heart Association and American Stroke Association. ?For more on getting your blood pressure under control, buy Controlling Your Blood Pressure, a Special Health Report from [facility].  ? ?

## 2022-03-03 NOTE — Addendum Note (Signed)
Addended by: Edwyna Shell I on: 03/03/2022 09:44 AM ? ? Modules accepted: Orders ? ?

## 2022-03-30 ENCOUNTER — Telehealth: Payer: Self-pay | Admitting: Cardiology

## 2022-03-30 MED ORDER — CARVEDILOL 12.5 MG PO TABS
ORAL_TABLET | ORAL | 3 refills | Status: DC
Start: 2022-03-30 — End: 2023-05-14

## 2022-03-30 MED ORDER — RANOLAZINE ER 500 MG PO TB12: 500.0000 mg | ORAL_TABLET | Freq: Two times a day (BID) | ORAL | 5 refills | Status: DC

## 2022-03-30 NOTE — Telephone Encounter (Signed)
Rx refill sent to pharmacy. 

## 2022-03-30 NOTE — Telephone Encounter (Signed)
Patient needs refill on COREG 12.'5MG'$  and RANEXA 500 MG sent to [facility] on [address].

## 2022-05-21 DIAGNOSIS — D497 Neoplasm of unspecified behavior of endocrine glands and other parts of nervous system: Secondary | ICD-10-CM | POA: Diagnosis not present

## 2022-05-21 DIAGNOSIS — Z0000 Encounter for general adult medical examination without abnormal findings: Secondary | ICD-10-CM | POA: Diagnosis not present

## 2022-05-21 DIAGNOSIS — E785 Hyperlipidemia, unspecified: Secondary | ICD-10-CM | POA: Diagnosis not present

## 2022-05-21 DIAGNOSIS — G63 Polyneuropathy in diseases classified elsewhere: Secondary | ICD-10-CM | POA: Diagnosis not present

## 2022-05-21 DIAGNOSIS — R7301 Impaired fasting glucose: Secondary | ICD-10-CM | POA: Diagnosis not present

## 2022-05-21 DIAGNOSIS — D500 Iron deficiency anemia secondary to blood loss (chronic): Secondary | ICD-10-CM | POA: Diagnosis not present

## 2022-05-21 DIAGNOSIS — J0141 Acute recurrent pansinusitis: Secondary | ICD-10-CM | POA: Diagnosis not present

## 2022-08-19 ENCOUNTER — Encounter: Payer: Self-pay | Admitting: Cardiology

## 2022-08-19 ENCOUNTER — Ambulatory Visit: Payer: Medicare Other | Attending: Cardiology | Admitting: Cardiology

## 2022-08-19 VITALS — BP 174/80 | HR 76 | Ht 63.6 in | Wt 187.2 lb

## 2022-08-19 DIAGNOSIS — R000 Tachycardia, unspecified: Secondary | ICD-10-CM

## 2022-08-19 DIAGNOSIS — I10 Essential (primary) hypertension: Secondary | ICD-10-CM | POA: Insufficient documentation

## 2022-08-19 DIAGNOSIS — E782 Mixed hyperlipidemia: Secondary | ICD-10-CM | POA: Diagnosis not present

## 2022-08-19 DIAGNOSIS — Z23 Encounter for immunization: Secondary | ICD-10-CM | POA: Diagnosis not present

## 2022-08-19 DIAGNOSIS — I2510 Atherosclerotic heart disease of native coronary artery without angina pectoris: Secondary | ICD-10-CM | POA: Diagnosis not present

## 2022-08-19 HISTORY — DX: Tachycardia, unspecified: R00.0

## 2022-08-19 NOTE — Progress Notes (Signed)
Cardiology Office Note:    Date:  08/19/2022   ID:  Sara Ramos, MRN [id number]  PCP:  Street, Sharon Mt, MD  Cardiologist:  Shirlee More, MD    Referring MD: [address] Sharon Mt, *    ASSESSMENT:    1. Tachycardia   2. Coronary artery disease involving native coronary artery of native heart without angina pectoris   3. Essential hypertension   4. Mixed hyperlipidemia    PLAN:    In order of problems listed above:  This actually is a working visit because she had episodes of her heart being rapid at night associated with chest pain.  She has a smart watch but is never set it up for atrial fibrillation detection or EKG recording I Georgina Peer give her a handout how to do and she is going to call her granddaughter to come to the house and set it up and she is also going to purchase the mobile Kardia device.  I gave her the option of an event recorder for the next 30 days and she prefers to use her wearable device.  I suspect she is having atrial fibrillation if detected she need anticoagulation and consideration of antiarrhythmic drug. Stable CAD and I think she needs a repeat ischemia evaluation these episodes are clearly in the context of paroxysmal nocturnal rapid heart rhythm. Well-controlled with ambulatory blood pressure validated device and good technique continue her diuretic beta-blocker Continue her statin with CAD   Next appointment: 3 months   Medication Adjustments/Labs and Tests Ordered: Current medicines are reviewed at length with the patient today.  Concerns regarding medicines are outlined above.  No orders of the defined types were placed in this encounter.  No orders of the defined types were placed in this encounter.   Chief Complaint  Patient presents with   Follow-up   Coronary Artery Disease    History of Present Illness:    Sara Ramos is a 76 y.o. female with a hx of coronary artery disease with coronary calcium score  07/02/2020 and 75/66 percentile and minimal CAD but less than 24% stenosis artery coronary artery and LAD, hypertension and hyperlipidemia last seen 03/03/2022.  Compliance with diet, lifestyle and medications: Yes  I was unsure why she is here she told me she has had 2 episodes and called the office to be worked in they occur at nighttime her heart is rapid rates are greater than 120-125 and she was unaware of this but she has an Apple watch that she is never set up for EKG or atrial fibrillation detection.  Associated with rapid heart rate she has chest discomfort the episodes lasted for hours.  She has no documented history of atrial fibrillation but says she has had episodes like this in the past.  She is not having exertional chest pain shortness of breath but she has edema from taking gabapentin. She tracks blood pressure at home which is well controlled running 115/60 to 146/65 Past Medical History:  Diagnosis Date   Aortic atherosclerosis (HCC)    Benign essential hypertension    Dyslipidemia    GERD (gastroesophageal reflux disease)    Morbid obesity (HCC)    Primary osteoarthritis of knees, bilateral    Renal cyst, left     Past Surgical History:  Procedure Laterality Date   CHOLECYSTECTOMY  1991   KNEE SURGERY     TONSILLECTOMY      Current Medications: Current Meds  Medication Sig   atorvastatin (LIPITOR)  40 MG tablet TAKE 1 TABLET(40 MG) BY MOUTH DAILY   diphenhydrAMINE HCl (BENADRYL ALLERGY PO) Take 1 tablet by mouth daily as needed (Allergies).   meclizine (ANTIVERT) 25 MG tablet Take 25 mg by mouth 3 (three) times daily as needed for dizziness.   Melatonin 1 MG CAPS Take by mouth at bedtime.   ranolazine (RANEXA) 500 MG 12 hr tablet Take 1 tablet (500 mg total) by mouth 2 (two) times daily.   triamterene-hydrochlorothiazide (MAXZIDE-25) 37.5-25 MG tablet Take 1 tablet by mouth daily.     Allergies:   Aspirin, Cipro [ciprofloxacin in d5w], Dexamethasone,  Dyclonine-fd&c blue #1-fd&c red #40-menth [dyclonine hcl], Niacin and related, Nitroglycerin, Prednisone, Proxyphylline, Septra [sulfamethoxazole-trimethoprim], and Vioxx [rofecoxib]   Social History   Socioeconomic History   Marital status: Married    Spouse name: Not on file   Number of children: Not on file   Years of education: Not on file   Highest education level: Not on file  Occupational History   Not on file  Tobacco Use   Smoking status: Former    Passive exposure: Past   Smokeless tobacco: Never  Vaping Use   Vaping Use: Never used  Substance and Sexual Activity   Alcohol use: Never   Drug use: Never   Sexual activity: Not on file  Other Topics Concern   Not on file  Social History Narrative   Not on file   Social Determinants of Health   Financial Resource Strain: Not on file  Food Insecurity: Not on file  Transportation Needs: Not on file  Physical Activity: Not on file  Stress: Not on file  Social Connections: Not on file     Family History: The patient's family history includes CAD in her mother; Colon cancer in her mother; Diabetes in her mother; Peripheral vascular disease in her father. ROS:   Please see the history of present illness.    All other systems reviewed and are negative.  EKGs/Labs/Other Studies Reviewed:    The following studies were reviewed today:  EKG:  EKG ordered today and personally reviewed.  The ekg ordered today demonstrates sinus rhythm poor R wave progression otherwise normal EKG    Physical Exam:    VS:  BP (!) 174/80   Pulse 76   Ht 5' 3.6" (1.615 m)   Wt 187 lb 3.2 oz (84.9 kg)   SpO2 96%   BMI 32.54 kg/m     Wt Readings from Last 3 Encounters:  08/19/22 187 lb 3.2 oz (84.9 kg)  03/03/22 181 lb (82.1 kg)  08/27/21 172 lb (78 kg)     GEN:  Well nourished, well developed in no acute distress HEENT: Normal NECK: No JVD; No carotid bruits LYMPHATICS: No lymphadenopathy CARDIAC: RRR, no murmurs, rubs,  gallops RESPIRATORY:  Clear to auscultation without rales, wheezing or rhonchi  ABDOMEN: Soft, non-tender, non-distended MUSCULOSKELETAL:  No edema; No deformity  SKIN: Warm and dry NEUROLOGIC:  Alert and oriented x 3 PSYCHIATRIC:  Normal affect    Signed, Shirlee More, MD  08/19/2022 3:33 PM    [facility]

## 2022-08-19 NOTE — Patient Instructions (Addendum)
KardiaMobile [email or web address]        FDA-cleared, clinical grade mobile EKG monitor: Jodelle Red is the most clinically-validated mobile EKG used by the world's leading cardiac care medical professionals With Basic service, know instantly if your heart rhythm is normal or if atrial fibrillation is detected, and email the last single EKG recording to yourself or your doctor Premium service, available for purchase through the Kardia app for $9.99 per month or $99 per year, includes unlimited history and storage of your EKG recordings, a monthly EKG summary report to share with your doctor, along with the ability to track your blood pressure, activity and weight Includes one KardiaMobile phone clip FREE SHIPPING: Standard delivery 1-3 business days. Orders placed by 11:00am PST will ship that afternoon. Otherwise, will ship next business day. All orders ship via [employer] from [place], [place]  Tips to measure your blood pressure correctly  To determine whether you have hypertension, a medical professional will take a blood pressure reading. How you prepare for the test, the position of your arm, and other factors can change a blood pressure reading by 10% or more. That could be enough to hide high blood pressure, start you on a drug you don't really need, or lead your doctor to incorrectly adjust your medications. National and international guidelines offer specific instructions for measuring blood pressure. If a doctor, nurse, or medical assistant isn't doing it right, don't hesitate to ask him or her to get with the guidelines. Here's what you can do to ensure a correct reading:  Don't drink a caffeinated beverage or smoke during the 30 minutes before the test.  Sit quietly for five minutes before the test begins.  During the measurement, sit in a chair with your feet on the floor and your arm supported so your elbow is at about heart level.  The  inflatable part of the cuff should completely cover at least 80% of your upper arm, and the cuff should be placed on bare skin, not over a shirt.  Don't talk during the measurement.  Have your blood pressure measured twice, with a brief break in between. If the readings are different by 5 points or more, have it done a third time. There are times to break these rules. If you sometimes feel lightheaded when getting out of bed in the morning or when you stand after sitting, you should have your blood pressure checked while seated and then while standing to see if it falls from one position to the next. Because blood pressure varies throughout the day, your doctor will rarely diagnose hypertension on the basis of a single reading. Instead, he or she will want to confirm the measurements on at least two occasions, usually within a few weeks of one another. The exception to this rule is if you have a blood pressure reading of 180/110 mm Hg or higher. A result this high usually calls for prompt treatment. It's also a good idea to have your blood pressure measured in both arms at least once, since the reading in one arm (usually the right) may be higher than that in the left. A 2014 study in The American Journal of Medicine of nearly 3,400 people found average arm- to-arm differences in systolic blood pressure of about 5 points. The higher number should be used to make treatment decisions. In 2017, new guidelines from the [place], the [employer] of Cardiology, and nine other health organizations lowered the diagnosis of high  blood pressure to 130/80 mm Hg or higher for all adults. The guidelines also redefined the various blood pressure categories to now include normal, elevated, Stage 1 hypertension, Stage 2 hypertension, and hypertensive crisis (see "Blood pressure categories"). Blood pressure categories  Blood pressure category SYSTOLIC (upper number)  DIASTOLIC (lower number)  Normal  Less than 120 mm Hg and Less than 80 mm Hg  Elevated 120-129 mm Hg and Less than 80 mm Hg  High blood pressure: Stage 1 hypertension 130-139 mm Hg or 80-89 mm Hg  High blood pressure: Stage 2 hypertension 140 mm Hg or higher or 90 mm Hg or higher  Hypertensive crisis (consult your doctor immediately) Higher than 180 mm Hg and/or Higher than 120 mm Hg  Source: American Heart Association and American Stroke Association. For more on getting your blood pressure under control, buy Controlling Your Blood Pressure, a Special Health Report from [facility]. Medication Instructions:  Your physician recommends that you continue on your current medications as directed. Please refer to the Current Medication list given to you today.  *If you need a refill on your cardiac medications before your next appointment, please call your pharmacy*   Lab Work: NONE If you have labs (blood work) drawn today and your tests are completely normal, you will receive your results only by: [place] (if you have MyChart) OR A paper copy in the mail If you have any lab test that is abnormal or we need to change your treatment, we will call you to review the results.   Testing/Procedures: NONE   Follow-Up: At [facility], you and your health needs are our priority.  As part of our continuing mission to provide you with exceptional heart care, we have created designated Provider Care Teams.  These Care Teams include your primary Cardiologist (physician) and Advanced Practice Providers (APPs -  Physician Assistants and Nurse Practitioners) who all work together to provide you with the care you need, when you need it.  We recommend signing up for the patient portal called "MyChart".  Sign up information is provided on this After Visit Summary.  MyChart is used to connect with patients for Virtual Visits (Telemedicine).  Patients are able to view lab/test results, encounter notes, upcoming  appointments, etc.  Non-urgent messages can be sent to your provider as well.   To learn more about what you can do with MyChart, go to [email or web address].    Your next appointment:   3 month(s)  The format for your next appointment:   In Person  Provider:   Shirlee More, MD    Other Instructions Set up [place] for EKG. Send results by MyChart  Important Information About Sugar

## 2022-08-19 NOTE — Addendum Note (Signed)
Addended by: Jerl Santos R on: 08/19/2022 03:41 PM   Modules accepted: Orders

## 2022-09-16 DIAGNOSIS — M5451 Vertebrogenic low back pain: Secondary | ICD-10-CM | POA: Diagnosis not present

## 2022-09-22 ENCOUNTER — Other Ambulatory Visit: Payer: Self-pay | Admitting: Cardiology

## 2022-09-24 DIAGNOSIS — M5416 Radiculopathy, lumbar region: Secondary | ICD-10-CM | POA: Diagnosis not present

## 2022-09-24 DIAGNOSIS — M47896 Other spondylosis, lumbar region: Secondary | ICD-10-CM | POA: Diagnosis not present

## 2022-09-29 DIAGNOSIS — M5416 Radiculopathy, lumbar region: Secondary | ICD-10-CM | POA: Diagnosis not present

## 2022-09-29 HISTORY — DX: Radiculopathy, lumbar region: M54.16

## 2022-10-22 DIAGNOSIS — M5416 Radiculopathy, lumbar region: Secondary | ICD-10-CM | POA: Diagnosis not present

## 2022-10-22 DIAGNOSIS — M533 Sacrococcygeal disorders, not elsewhere classified: Secondary | ICD-10-CM | POA: Diagnosis not present

## 2022-11-21 NOTE — Progress Notes (Unsigned)
Cardiology Office Note:    Date:  11/23/2022   ID:  Sara Ramos, DOB 1946/10/03, MRN [id number]  PCP:  Street, Sara Mt, MD  Cardiologist:  Sara More, MD    Referring MD: [address] Sara Ramos, *    ASSESSMENT:    1. Mild CAD   2. Essential hypertension   3. Mixed hyperlipidemia    PLAN:    In order of problems listed above:  Sara Ramos has done well with her mild CAD having no anginal discomfort and on good medical therapy including low-dose aspirin beta-blocker and high intensity statin.  Her lipids are at target On concerned her blood pressure is not controlled we will do a 2-week log drop in my office and if needed transition from combination diuretic to ARB thiazide diuretic High risk continue her statin for CAD   Next appointment: 1 year   Medication Adjustments/Labs and Tests Ordered: Current medicines are reviewed at length with the patient today.  Concerns regarding medicines are outlined above.  No orders of the defined types were placed in this encounter.  No orders of the defined types were placed in this encounter.   Chief Complaint  Patient presents with   Follow-up   Coronary Artery Disease    History of Present Illness:    Sara Ramos is a 77 y.o. female with a hx of CAD with coronary artery calcium score of 75/66 percentile minimal CAD less than 25% stenosis left anterior descending coronary artery hypertension hyperlipidemia and palpitation with rapid heart rates greater than 100-125 bpm last seen 08/19/2022.  Compliance with diet, lifestyle and medications: No  She is quite troubled by back pain however she is not having chest discomfort edema shortness of breath palpitation or syncope Offered a prescription for nitroglycerin she told me in the past she had syncope with that. She tolerates her lipid-lowering without muscle pain or weakness her last profile performed 05/21/2022 cholesterol 154 triglycerides 188 A1c 4.9% hemoglobin 13.3  creatinine 0.8 potassium 3.6 She checks her blood pressure at home not at rest with pain and it generally runs 140-145/70-80 Sure for hypertension is well-controlled and move adding another medication she will use good technique with a validated device for 2 weeks checking her blood pressure midday and leave a list in the office.  If not at target I will transition her to a combination of ARB thiazide diuretic. Past Medical History:  Diagnosis Date   Aortic atherosclerosis (HCC)    Benign essential hypertension    Dyslipidemia    GERD (gastroesophageal reflux disease)    Morbid obesity (HCC)    Primary osteoarthritis of knees, bilateral    Renal cyst, left     Past Surgical History:  Procedure Laterality Date   CHOLECYSTECTOMY  1991   KNEE SURGERY     TONSILLECTOMY      Current Medications: No outpatient medications have been marked as taking for the 11/23/22 encounter (Office Visit) with Sara Priest, MD.     Allergies:   Aspirin, Cipro [ciprofloxacin in d5w], Dexamethasone, Dyclonine-fd&c blue #1-fd&c red #40-menth [dyclonine hcl], Niacin and related, Nitroglycerin, Prednisone, Proxyphylline, Septra [sulfamethoxazole-trimethoprim], and Vioxx [rofecoxib]   Social History   Socioeconomic History   Marital status: Married    Spouse name: Not on file   Number of children: Not on file   Years of education: Not on file   Highest education level: Not on file  Occupational History   Not on file  Tobacco Use   Smoking status:  Former    Passive exposure: Past   Smokeless tobacco: Never  Vaping Use   Vaping Use: Never used  Substance and Sexual Activity   Alcohol use: Never   Drug use: Never   Sexual activity: Not on file  Other Topics Concern   Not on file  Social History Narrative   Not on file   Social Determinants of Health   Financial Resource Strain: Not on file  Food Insecurity: Not on file  Transportation Needs: Not on file  Physical Activity: Not on file   Stress: Not on file  Social Connections: Not on file     Family History: The patient's family history includes CAD in her mother; Colon cancer in her mother; Diabetes in her mother; Peripheral vascular disease in her father. ROS:   Please see the history of present illness.    All other systems reviewed and are negative.  EKGs/Labs/Other Studies Reviewed:    The following studies were reviewed today:  EKG: I did not repeat EKG today  Recent Labs: See history labs are followed in her PCP office  Physical Exam:    VS:  BP (!) 144/80 (BP Location: Right Arm, Patient Position: Sitting)   Pulse 70   Ht '5\' 3"'$  (1.6 m)   Wt 190 lb (86.2 kg)   SpO2 96%   BMI 33.66 kg/m     Wt Readings from Last 3 Encounters:  11/23/22 190 lb (86.2 kg)  08/19/22 187 lb 3.2 oz (84.9 kg)  03/03/22 181 lb (82.1 kg)     GEN:  Well nourished, well developed in no acute distress HEENT: Normal NECK: No JVD; No carotid bruits LYMPHATICS: No lymphadenopathy CARDIAC: RRR, no murmurs, rubs, gallops RESPIRATORY:  Clear to auscultation without rales, wheezing or rhonchi  ABDOMEN: Soft, non-tender, non-distended MUSCULOSKELETAL:  No edema; No deformity  SKIN: Warm and dry NEUROLOGIC:  Alert and oriented x 3 PSYCHIATRIC:  Normal affect    Signed, Sara More, MD  11/23/2022 8:22 AM    [facility]

## 2022-11-23 ENCOUNTER — Ambulatory Visit: Payer: Medicare Other | Attending: Cardiology | Admitting: Cardiology

## 2022-11-23 ENCOUNTER — Encounter: Payer: Self-pay | Admitting: Cardiology

## 2022-11-23 VITALS — BP 144/80 | HR 70 | Ht 63.0 in | Wt 190.0 lb

## 2022-11-23 DIAGNOSIS — E782 Mixed hyperlipidemia: Secondary | ICD-10-CM | POA: Insufficient documentation

## 2022-11-23 DIAGNOSIS — I2510 Atherosclerotic heart disease of native coronary artery without angina pectoris: Secondary | ICD-10-CM | POA: Insufficient documentation

## 2022-11-23 DIAGNOSIS — I10 Essential (primary) hypertension: Secondary | ICD-10-CM | POA: Diagnosis not present

## 2022-11-23 NOTE — Patient Instructions (Signed)
Medication Instructions:  Your physician recommends that you continue on your current medications as directed. Please refer to the Current Medication list given to you today.  *If you need a refill on your cardiac medications before your next appointment, please call your pharmacy*   Lab Work: None If you have labs (blood work) drawn today and your tests are completely normal, you will receive your results only by: [place] (if you have MyChart) OR A paper copy in the mail If you have any lab test that is abnormal or we need to change your treatment, we will call you to review the results.   Testing/Procedures: None   Follow-Up: At [facility], you and your health needs are our priority.  As part of our continuing mission to provide you with exceptional heart care, we have created designated Provider Care Teams.  These Care Teams include your primary Cardiologist (physician) and Advanced Practice Providers (APPs -  Physician Assistants and Nurse Practitioners) who all work together to provide you with the care you need, when you need it.  We recommend signing up for the patient portal called "MyChart".  Sign up information is provided on this After Visit Summary.  MyChart is used to connect with patients for Virtual Visits (Telemedicine).  Patients are able to view lab/test results, encounter notes, upcoming appointments, etc.  Non-urgent messages can be sent to your provider as well.   To learn more about what you can do with MyChart, go to [email or web address].    Your next appointment:   1 year(s)  Provider:   Shirlee More, MD    Other Instructions Check blood pressure for 2 weeks and bring to my office in 2 weeks.

## 2022-12-09 ENCOUNTER — Telehealth: Payer: Self-pay

## 2022-12-09 NOTE — Telephone Encounter (Signed)
Patient dropped past 2 week home blood pressure readings off as requested by Dr Bettina Gavia. Reading in folder for review

## 2022-12-14 ENCOUNTER — Telehealth: Payer: Self-pay

## 2022-12-14 ENCOUNTER — Other Ambulatory Visit: Payer: Self-pay

## 2022-12-14 MED ORDER — VALSARTAN-HYDROCHLOROTHIAZIDE 80-12.5 MG PO TABS: 1.0000 | ORAL_TABLET | Freq: Every day | ORAL | 3 refills | Status: DC

## 2022-12-14 NOTE — Telephone Encounter (Signed)
Received the following message from Dr. Bettina Gavia regarding this patient:  "I am not pleased with her blood pressures she is not at target I think she do better she stopped her diuretic and switch to valsartan 80 hydrochlorothiazide 12 and half and send another list of blood pressures in 2 weeks."  I informed the patient of Dr. Joya Gaskins recommendations and she was agreeable with this plan and had no further questions at this time.

## 2022-12-31 DIAGNOSIS — M533 Sacrococcygeal disorders, not elsewhere classified: Secondary | ICD-10-CM | POA: Diagnosis not present

## 2023-01-05 ENCOUNTER — Telehealth: Payer: Self-pay

## 2023-01-05 ENCOUNTER — Other Ambulatory Visit: Payer: Self-pay

## 2023-01-05 NOTE — Telephone Encounter (Signed)
Called patient and informed her of Dr. Joya Gaskins response regarding her blood pressure list, below:  "BP's good no changes"  Patient was appreciative for the call and had no further questions at this time.

## 2023-05-13 ENCOUNTER — Other Ambulatory Visit: Payer: Self-pay | Admitting: Cardiology

## 2023-06-25 ENCOUNTER — Other Ambulatory Visit: Payer: Self-pay | Admitting: Cardiology

## 2023-06-29 DIAGNOSIS — D500 Iron deficiency anemia secondary to blood loss (chronic): Secondary | ICD-10-CM | POA: Diagnosis not present

## 2023-06-29 DIAGNOSIS — E785 Hyperlipidemia, unspecified: Secondary | ICD-10-CM | POA: Diagnosis not present

## 2023-06-29 DIAGNOSIS — R7301 Impaired fasting glucose: Secondary | ICD-10-CM | POA: Diagnosis not present

## 2023-07-06 DIAGNOSIS — Z0000 Encounter for general adult medical examination without abnormal findings: Secondary | ICD-10-CM | POA: Diagnosis not present

## 2023-07-06 DIAGNOSIS — I25119 Atherosclerotic heart disease of native coronary artery with unspecified angina pectoris: Secondary | ICD-10-CM | POA: Diagnosis not present

## 2023-07-06 DIAGNOSIS — E785 Hyperlipidemia, unspecified: Secondary | ICD-10-CM | POA: Diagnosis not present

## 2023-07-06 DIAGNOSIS — I700 Atherosclerosis of aorta: Secondary | ICD-10-CM | POA: Diagnosis not present

## 2023-07-06 DIAGNOSIS — I10 Essential (primary) hypertension: Secondary | ICD-10-CM | POA: Diagnosis not present

## 2023-08-23 ENCOUNTER — Other Ambulatory Visit: Payer: Self-pay | Admitting: *Deleted

## 2023-08-23 DIAGNOSIS — I774 Celiac artery compression syndrome: Secondary | ICD-10-CM

## 2023-08-23 DIAGNOSIS — K551 Chronic vascular disorders of intestine: Secondary | ICD-10-CM

## 2023-08-23 DIAGNOSIS — N281 Cyst of kidney, acquired: Secondary | ICD-10-CM

## 2023-08-31 DIAGNOSIS — Z23 Encounter for immunization: Secondary | ICD-10-CM | POA: Diagnosis not present

## 2023-09-01 ENCOUNTER — Ambulatory Visit (HOSPITAL_COMMUNITY)
Admission: RE | Admit: 2023-09-01 | Discharge: 2023-09-01 | Disposition: A | Discharge status: Home / Self Care | Payer: Medicare Other | Source: Ambulatory Visit | Attending: Vascular Surgery | Admitting: Vascular Surgery

## 2023-09-01 ENCOUNTER — Encounter (HOSPITAL_COMMUNITY): Payer: Self-pay

## 2023-09-01 ENCOUNTER — Ambulatory Visit (HOSPITAL_COMMUNITY)
Admission: RE | Admit: 2023-09-01 | Discharge: 2023-09-01 | Disposition: A | Discharge status: Home / Self Care | Payer: Medicare Other | Source: Ambulatory Visit | Attending: Vascular Surgery

## 2023-09-01 ENCOUNTER — Ambulatory Visit (INDEPENDENT_AMBULATORY_CARE_PROVIDER_SITE_OTHER): Payer: Medicare Other | Admitting: Physician Assistant

## 2023-09-01 VITALS — BP 161/82 | HR 57 | Temp 97.2°F | Ht 63.0 in | Wt 196.8 lb

## 2023-09-01 DIAGNOSIS — I774 Celiac artery compression syndrome: Secondary | ICD-10-CM | POA: Diagnosis not present

## 2023-09-01 DIAGNOSIS — N281 Cyst of kidney, acquired: Secondary | ICD-10-CM | POA: Diagnosis not present

## 2023-09-01 DIAGNOSIS — K551 Chronic vascular disorders of intestine: Secondary | ICD-10-CM

## 2023-09-01 NOTE — Progress Notes (Signed)
Office Note   History of Present Illness   Sara Ramos is a 77 y.o. 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  She has no prior history of food fear or postprandial pain.   She returns today for follow-up.  She denies any issues with unintentional weight loss, postprandial pain, or food fear.  She has no history of renal dysfunction.  She is on 3 medications for hypertension.  She takes a daily statin.  She cannot take aspirin due to severe intolerance. Current Outpatient Medications  Medication Sig Dispense Refill   rosuvastatin (CRESTOR) 10 MG tablet Take 10 mg by mouth daily.     Ascorbic Acid (VITA-C PO) Take 1 tablet by mouth daily.     aspirin EC 81 MG tablet Take 1 tablet (81 mg total) by mouth daily. Swallow whole. 90 tablet 3   atorvastatin (LIPITOR) 40 MG tablet TAKE 1 TABLET(40 MG) BY MOUTH DAILY 90 tablet 3   carvedilol (COREG) 12.5 MG tablet TAKE 1 TABLET(12.5 MG) BY MOUTH TWICE DAILY 180 tablet 3   diphenhydrAMINE HCl (BENADRYL ALLERGY PO) Take 1 tablet by mouth daily as needed (Allergies).     docusate sodium (STOOL SOFTENER) 100 MG capsule Take 100 mg by mouth at bedtime.     gabapentin (NEURONTIN) 300 MG capsule Take 1 capsule (300 mg total) by mouth at bedtime. (Patient taking differently: Take 300 mg by mouth at bedtime as needed (PAIN).) 90 capsule 3   Melatonin 1 MG CAPS Take by mouth at bedtime.     Probiotic Product (PROBIOTIC DAILY) CAPS Take 1 capsule by mouth daily.     ranolazine (RANEXA) 500 MG 12 hr tablet TAKE 1 TABLET(500 MG) BY MOUTH TWICE DAILY 180 tablet 2   valsartan-hydrochlorothiazide (DIOVAN HCT) 80-12.5 MG tablet Take 1 tablet by mouth daily. 90 tablet 3   No current facility-administered medications for this visit.    REVIEW OF SYSTEMS (negative unless checked):   Cardiac:  []  Chest pain or chest pressure? []  Shortness of breath upon activity? []   Shortness of breath when lying flat? []  Irregular heart rhythm?  Vascular:  []  Pain in calf, thigh, or hip brought on by walking? []  Pain in feet at night that wakes you up from your sleep? []  Blood clot in your veins? []  Leg swelling?  Pulmonary:  []  Oxygen at home? []  Productive cough? []  Wheezing?  Neurologic:  []  Sudden weakness in arms or legs? []  Sudden numbness in arms or legs? []  Sudden onset of difficult speaking or slurred speech? []  Temporary loss of vision in one eye? []  Problems with dizziness?  Gastrointestinal:  []  Blood in stool? []  Vomited blood?  Genitourinary:  []  Burning when urinating? []  Blood in urine?  Psychiatric:  []  Major depression  Hematologic:  []  Bleeding problems? []  Problems with blood clotting?  Dermatologic:  []  Rashes or ulcers?  Constitutional:  []  Fever or chills?  Ear/Nose/Throat:  []  Change in hearing? []  Nose bleeds? []  Sore throat?  Musculoskeletal:  []  Back pain? []  Joint pain? []  Muscle pain?   Physical Examination   Vitals:   09/01/23 0831  BP: (!) 161/82  Pulse: (!) 57  Temp: (!) 97.2 F (36.2 C)  SpO2: 95%  Weight: 196 lb 12.8 oz (89.3 kg)  Height: 5\' 3"  (1.6 m)   Body mass index is 34.86 kg/m.  General:  WDWN in NAD; vital signs documented above Gait: Not  observed HENT: WNL, normocephalic Pulmonary: normal non-labored breathing , without rales, rhonchi,  wheezing Cardiac: regular Abdomen: soft, NT, no masses Skin: without rashes Vascular Exam/Pulses: 2+ DP pulses bilaterally Extremities: without ischemic changes, without gangrene , without cellulitis; without open wounds;  Musculoskeletal: no muscle wasting or atrophy  Neurologic: A&O X 3;  No focal weakness or paresthesias are detected Psychiatric:  The pt has Normal affect.   Non-Invasive Vascular Imaging   Mesenteric Duplex (09/01/2023) Stable celiac and superior mesenteric artery stenosis, less than 70%.  1 to 59% stenosis of  bilateral renal arteries.   Medical Decision Making   Sara Ramos is a 77 y.o. (14-Sep-1946) female who presents for surveillance of mesenteric stenosis  Based on this patient's duplex, her celiac and superior mesenteric artery stenosis is unchanged at less than 70%.  Maximum PSV of SMA is 257 cm/s She denies any abdominal pain, postprandial pain, unintentional weight loss, or food fear. She has no issues with kidney dysfunction.  Her blood pressure remains well-controlled.  Duplex demonstrates 1 to 59% stenosis of bilateral renal arteries. On exam she has 2+ radial and DP pulses.  She has no abdominal tenderness on palpation. She can follow up with our office in 2 years with repeat mesenteric duplex. She should continue her daily statin.   Loel Dubonnet PA-C [facility] Office: [phone]  Clinic MD: Randie Heinz

## 2023-10-11 DIAGNOSIS — J029 Acute pharyngitis, unspecified: Secondary | ICD-10-CM | POA: Diagnosis not present

## 2023-10-11 DIAGNOSIS — I701 Atherosclerosis of renal artery: Secondary | ICD-10-CM | POA: Diagnosis not present

## 2023-10-11 DIAGNOSIS — Z6835 Body mass index (BMI) 35.0-35.9, adult: Secondary | ICD-10-CM | POA: Diagnosis not present

## 2023-10-11 DIAGNOSIS — I10 Essential (primary) hypertension: Secondary | ICD-10-CM | POA: Diagnosis not present

## 2023-11-25 ENCOUNTER — Encounter: Payer: Self-pay | Admitting: Cardiology

## 2023-11-25 NOTE — Progress Notes (Signed)
Cardiology Office Note:    Date:  11/26/2023   ID:  [name], DOB [birth year], MRN [id number]  PCP:  Street, Stephanie Coup, MD  Cardiologist:  Norman Herrlich, MD    Referring MD: [address] Stephanie Coup, *    ASSESSMENT:    1. Mild CAD   2. Essential hypertension   3. Mixed hyperlipidemia   4. Tachycardia   5. Bilateral lower extremity edema    PLAN:    In order of problems listed above:  Her cardiology perspective she is doing well she is not on antiplatelet therapy and can initiate clopidogrel I do not think she has a true aspirin allergy and she will continue her beta-blocker and lipid-lowering with a high intensity statin and ranolazine has been quite effective in relieving angina QT interval is normal Well-controlled I asked her to bring a list of blood pressures in the future continue current treatment ARB thiazide diuretic and carvedilol Good result continue high intensity statin Lower extremity edema related to gabapentin   Next appointment: 1 year   Medication Adjustments/Labs and Tests Ordered: Current medicines are reviewed at length with the patient today.  Concerns regarding medicines are outlined above.  Orders Placed This Encounter  Procedures   EKG 12-Lead   No orders of the defined types were placed in this encounter.    History of Present Illness:    Sara Ramos is a 78 y.o. female with a hx of CAD with coronary artery calcium score of 75/66 percentile minimal CAD less than 25% stenosis left anterior descending coronary artery hypertension hyperlipidemia and palpitation with rapid heart rates greater than 100-125 bpm last seen 11/23/2022.  Recent labs 06/29/2023 cholesterol 160 HDL 46 non-HDL cholesterol 114 triglycerides 201 A1c 5.2% hemoglobin 13.2  compliance with diet, lifestyle and medications: Yes  A lot of issues her predominant concern is lower leg swelling she attributes it to ranolazine she takes gabapentin is clearly the culprit I  told her to stop it and contact the prescribing physician for alternate treatment for neuropathy Rarely she gets palpitation she has a smart watch she has never been set up and I asked her to activated if she captures an abnormality she can send it to me through MyChart it is so rare and short I do not think an event monitor would be of any benefit. She tolerates her lipid-lowering therapy without muscle pain or weakness She is not taking aspirin she tells me she is allergic to it but she can take it time to time without a problem I am not going to initiate clopidogrel I asked her to record her home blood pressures and bring a list when she comes to doctor she tells me that she generally runs less than 140/70 She has not needed nitroglycerin Past Medical History:  Diagnosis Date   Aortic atherosclerosis (HCC)    Benign essential hypertension    Coronary artery disease involving native coronary artery of native heart without angina pectoris 07/05/2020   Cystocele with rectocele 04/04/2021   Dyslipidemia    Essential hypertension 07/05/2020   GERD (gastroesophageal reflux disease)    Lumbar radiculopathy 09/29/2022   Mixed hyperlipidemia 07/05/2020   Morbid obesity (HCC)    Obesity (BMI 30-39.9) 07/05/2020   Primary osteoarthritis of knees, bilateral    Rectocele 04/04/2021   Renal cyst, left    Stenosis of celiac artery (HCC) 07/05/2020   Tachycardia 08/19/2022   Weakening of rectovaginal tissue 04/04/2021    Current Medications: Current Meds  Medication Sig   atorvastatin (LIPITOR) 40 MG tablet TAKE 1 TABLET(40 MG) BY MOUTH DAILY   carvedilol (COREG) 12.5 MG tablet TAKE 1 TABLET(12.5 MG) BY MOUTH TWICE DAILY   cyanocobalamin 100 MCG tablet Take 100 mcg by mouth daily.   diphenhydrAMINE HCl (BENADRYL ALLERGY PO) Take 1 tablet by mouth daily as needed (Allergies).   docusate sodium (STOOL SOFTENER) 100 MG capsule Take 100 mg by mouth at bedtime.   Melatonin 1 MG CAPS Take by mouth at  bedtime.   omeprazole (PRILOSEC) 20 MG capsule Take 20 mg by mouth daily.   omeprazole (PRILOSEC) 20 MG capsule Take 20 mg by mouth as needed (heartburn).   ranolazine (RANEXA) 500 MG 12 hr tablet TAKE 1 TABLET(500 MG) BY MOUTH TWICE DAILY   rosuvastatin (CRESTOR) 10 MG tablet Take 10 mg by mouth daily.   valsartan-hydrochlorothiazide (DIOVAN HCT) 80-12.5 MG tablet Take 1 tablet by mouth daily.   [DISCONTINUED] gabapentin (NEURONTIN) 300 MG capsule Take 1 capsule (300 mg total) by mouth at bedtime.      EKGs/Labs/Other Studies Reviewed:    The following studies were reviewed today:   EKG Interpretation Date/Time:  Friday November 26 2023 08:22:26 EST Ventricular Rate:  58 PR Interval:  186 QRS Duration:  92 QT Interval:  422 QTC Calculation: 414 R Axis:   -41  Text Interpretation: Sinus bradycardia Left axis deviation Low voltage QRS Poor R wave progression Late transition No previous ECGs available Confirmed by Norman Herrlich ([id number]) on 11/26/2023 8:26:41 AM    Physical Exam:    VS:  BP (!) 147/82   Pulse (!) 58   Ht 5\' 3"  (1.6 m)   Wt 203 lb 9.6 oz (92.4 kg)   SpO2 96%   BMI 36.07 kg/m     Wt Readings from Last 3 Encounters:  11/26/23 203 lb 9.6 oz (92.4 kg)  09/01/23 196 lb 12.8 oz (89.3 kg)  11/23/22 190 lb (86.2 kg)     GEN:  Well nourished, well developed in no acute distress HEENT: Normal NECK: No JVD; No carotid bruits LYMPHATICS: No lymphadenopathy CARDIAC: RRR, no murmurs, rubs, gallops RESPIRATORY:  Clear to auscultation without rales, wheezing or rhonchi  ABDOMEN: Soft, non-tender, non-distended MUSCULOSKELETAL:  No edema; No deformity  SKIN: Warm and dry NEUROLOGIC:  Alert and oriented x 3 PSYCHIATRIC:  Normal affect    Signed, Norman Herrlich, MD  11/26/2023 8:46 AM    [facility]

## 2023-11-26 ENCOUNTER — Encounter: Payer: Self-pay | Admitting: Cardiology

## 2023-11-26 ENCOUNTER — Ambulatory Visit: Payer: Medicare Other | Attending: Cardiology | Admitting: Cardiology

## 2023-11-26 VITALS — BP 147/82 | HR 58 | Ht 63.0 in | Wt 203.6 lb

## 2023-11-26 DIAGNOSIS — R600 Localized edema: Secondary | ICD-10-CM | POA: Diagnosis not present

## 2023-11-26 DIAGNOSIS — I2510 Atherosclerotic heart disease of native coronary artery without angina pectoris: Secondary | ICD-10-CM | POA: Diagnosis not present

## 2023-11-26 DIAGNOSIS — I10 Essential (primary) hypertension: Secondary | ICD-10-CM

## 2023-11-26 DIAGNOSIS — R000 Tachycardia, unspecified: Secondary | ICD-10-CM

## 2023-11-26 DIAGNOSIS — E782 Mixed hyperlipidemia: Secondary | ICD-10-CM

## 2023-11-26 NOTE — Patient Instructions (Signed)
Medication Instructions:  Your physician has recommended you make the following change in your medication:   STOP: Gabapentin  *If you need a refill on your cardiac medications before your next appointment, please call your pharmacy*   Lab Work: None If you have labs (blood work) drawn today and your tests are completely normal, you will receive your results only by: MyChart Message (if you have MyChart) OR A paper copy in the mail If you have any lab test that is abnormal or we need to change your treatment, we will call you to review the results.   Testing/Procedures: None   Follow-Up: At [facility], you and your health needs are our priority.  As part of our continuing mission to provide you with exceptional heart care, we have created designated Provider Care Teams.  These Care Teams include your primary Cardiologist (physician) and Advanced Practice Providers (APPs -  Physician Assistants and Nurse Practitioners) who all work together to provide you with the care you need, when you need it.  We recommend signing up for the patient portal called "MyChart".  Sign up information is provided on this After Visit Summary.  MyChart is used to connect with patients for Virtual Visits (Telemedicine).  Patients are able to view lab/test results, encounter notes, upcoming appointments, etc.  Non-urgent messages can be sent to your provider as well.   To learn more about what you can do with MyChart, go to [email or web address].    Your next appointment:   1 year(s)  Provider:   Norman Herrlich, MD  Other Instructions Check blood pressure daily and record. Bring to the office for your next appointment.

## 2023-12-05 ENCOUNTER — Encounter (HOSPITAL_BASED_OUTPATIENT_CLINIC_OR_DEPARTMENT_OTHER): Payer: Self-pay

## 2023-12-05 ENCOUNTER — Ambulatory Visit (HOSPITAL_BASED_OUTPATIENT_CLINIC_OR_DEPARTMENT_OTHER)
Admission: RE | Admit: 2023-12-05 | Discharge: 2023-12-05 | Disposition: A | Discharge status: Home / Self Care | Payer: Medicare Other | Source: Ambulatory Visit | Attending: Family Medicine | Admitting: Family Medicine

## 2023-12-05 VITALS — BP 141/81 | HR 76 | Temp 98.5°F | Resp 20

## 2023-12-05 DIAGNOSIS — J0141 Acute recurrent pansinusitis: Secondary | ICD-10-CM | POA: Diagnosis not present

## 2023-12-05 DIAGNOSIS — J3489 Other specified disorders of nose and nasal sinuses: Secondary | ICD-10-CM | POA: Diagnosis not present

## 2023-12-05 DIAGNOSIS — B3731 Acute candidiasis of vulva and vagina: Secondary | ICD-10-CM

## 2023-12-05 MED ORDER — FLUCONAZOLE 150 MG PO TABS: ORAL_TABLET | ORAL | 0 refills | Status: DC

## 2023-12-05 MED ORDER — CEFDINIR 300 MG PO CAPS: 300.0000 mg | ORAL_CAPSULE | Freq: Two times a day (BID) | ORAL | 0 refills | Status: AC

## 2023-12-05 NOTE — Discharge Instructions (Addendum)
Exam is consistent with sinusitis.  Encouraged sinus rinses.  Discussed nasal sprays but she has an allergy to dexamethasone and prednisone.  Discouraged use of OTC nasal sprays.  Continue Mucinex as directed on the package.  Cefdinir, 300 mg, twice daily for 10 days.  Get plenty of fluids and rest.  Follow-up if symptoms do not improve, worsen or new symptoms occur.

## 2023-12-05 NOTE — ED Triage Notes (Signed)
Pt c/o sinus congestion, runny nose,  coughing x 3 days.

## 2023-12-05 NOTE — ED Provider Notes (Signed)
[facility]    CSN: [id number] Arrival date & time: 12/05/23  4098      History   Chief Complaint Chief Complaint  Patient presents with   Nasal Congestion    Sinuses, headache, - Entered by patient    HPI Sara Ramos is a 78 y.o. female.   Reports cough and head congestion with significant sinus pressure and pain.  Symptoms for 3 to 4 days.  Patient reports she has sinusitis once or twice a year most years.  She has been on plain Mucinex and that has been helping her some.  Once her face starts hurting she knows it is getting bad.     Past Medical History:  Diagnosis Date   Aortic atherosclerosis (HCC)    Benign essential hypertension    Coronary artery disease involving native coronary artery of native heart without angina pectoris 07/05/2020   Cystocele with rectocele 04/04/2021   Dyslipidemia    Essential hypertension 07/05/2020   GERD (gastroesophageal reflux disease)    Lumbar radiculopathy 09/29/2022   Mixed hyperlipidemia 07/05/2020   Morbid obesity (HCC)    Obesity (BMI 30-39.9) 07/05/2020   Primary osteoarthritis of knees, bilateral    Rectocele 04/04/2021   Renal cyst, left    Stenosis of celiac artery (HCC) 07/05/2020   Tachycardia 08/19/2022   Weakening of rectovaginal tissue 04/04/2021    Patient Active Problem List   Diagnosis Date Noted   Lumbar radiculopathy 09/29/2022   Tachycardia 08/19/2022   Cystocele with rectocele 04/04/2021   Rectocele 04/04/2021   Weakening of rectovaginal tissue 04/04/2021   Benign essential hypertension    Dyslipidemia    Aortic atherosclerosis (HCC)    GERD (gastroesophageal reflux disease)    Primary osteoarthritis of knees, bilateral    Renal cyst, left    Coronary artery disease involving native coronary artery of native heart without angina pectoris 07/05/2020   Essential hypertension 07/05/2020   Mixed hyperlipidemia 07/05/2020   Stenosis of celiac artery (HCC) 07/05/2020   Obesity (BMI  30-39.9) 07/05/2020    Past Surgical History:  Procedure Laterality Date   CHOLECYSTECTOMY  1991   KNEE SURGERY     TONSILLECTOMY      OB History   No obstetric history on file.      Home Medications    Prior to Admission medications   Medication Sig Start Date End Date Taking? Authorizing Provider  carvedilol (COREG) 12.5 MG tablet TAKE 1 TABLET(12.5 MG) BY MOUTH TWICE DAILY 05/14/23  Yes Baldo Daub, MD  cefdinir (OMNICEF) 300 MG capsule Take 1 capsule (300 mg total) by mouth 2 (two) times daily for 10 days. 12/05/23 12/15/23 Yes Prescilla Sours, FNP  cyanocobalamin 100 MCG tablet Take 100 mcg by mouth daily. 04/04/21  Yes [provider]  docusate sodium (STOOL SOFTENER) 100 MG capsule Take 100 mg by mouth at bedtime.   Yes [provider]  fluconazole (DIFLUCAN) 150 MG tablet By mouth today and repeat in 5-7 days for vaginal yeast infection. 12/05/23  Yes Prescilla Sours, FNP  Melatonin 1 MG CAPS Take by mouth at bedtime.   Yes [provider]  omeprazole (PRILOSEC) 20 MG capsule Take 20 mg by mouth daily. 04/04/21  Yes [provider]  ranolazine (RANEXA) 500 MG 12 hr tablet TAKE 1 TABLET(500 MG) BY MOUTH TWICE DAILY 06/25/23  Yes Baldo Daub, MD  rosuvastatin (CRESTOR) 10 MG tablet Take 10 mg by mouth daily.   Yes [provider]  valsartan-hydrochlorothiazide (  DIOVAN HCT) 80-12.5 MG tablet Take 1 tablet by mouth daily. 12/14/22  Yes Baldo Daub, MD  atorvastatin (LIPITOR) 40 MG tablet TAKE 1 TABLET(40 MG) BY MOUTH DAILY 06/13/21   Tobb, Kardie, DO  diphenhydrAMINE HCl (BENADRYL ALLERGY PO) Take 1 tablet by mouth daily as needed (Allergies).    [provider]  omeprazole (PRILOSEC) 20 MG capsule Take 20 mg by mouth as needed (heartburn).    [provider]    Family History Family History  Problem Relation Age of Onset   Colon cancer Mother    CAD Mother    Diabetes Mother    Peripheral vascular disease Father      Social History Social History   Tobacco Use   Smoking status: Former    Passive exposure: Past   Smokeless tobacco: Never  Vaping Use   Vaping status: Never Used  Substance Use Topics   Alcohol use: Never   Drug use: Never     Allergies   Aspirin, Cipro [ciprofloxacin in d5w], Dexamethasone, Dyclonine-fd&c blue #1-fd&c red #40-menth [dyclonine hcl], Niacin and related, Nitroglycerin, Prednisone, Proxyphylline, Septra [sulfamethoxazole-trimethoprim], and Vioxx [rofecoxib]   Review of Systems Review of Systems  Constitutional:  Negative for chills and fever.  HENT:  Positive for congestion, sinus pressure and sinus pain. Negative for ear pain and sore throat.   Eyes:  Negative for pain and visual disturbance.  Respiratory:  Positive for cough. Negative for shortness of breath.   Cardiovascular:  Negative for chest pain and palpitations.  Gastrointestinal:  Negative for abdominal pain, constipation, diarrhea, nausea and vomiting.  Genitourinary:  Negative for dysuria and hematuria.  Musculoskeletal:  Negative for arthralgias and back pain.  Skin:  Negative for color change and rash.  Neurological:  Negative for seizures and syncope.  All other systems reviewed and are negative.    Physical Exam Triage Vital Signs ED Triage Vitals  Encounter Vitals Group     BP 12/05/23 0913 (!) 141/81     Systolic BP Percentile --      Diastolic BP Percentile --      Pulse Rate 12/05/23 0913 76     Resp 12/05/23 0913 20     Temp 12/05/23 0913 98.5 F (36.9 C)     Temp Source 12/05/23 0913 Oral     SpO2 12/05/23 0913 94 %     Weight --      Height --      Head Circumference --      Peak Flow --      Pain Score 12/05/23 0911 0     Pain Loc --      Pain Education --      Exclude from Growth Chart --    No data found.  Updated Vital Signs BP (!) 141/81 (BP Location: Right Arm)   Pulse 76   Temp 98.5 F (36.9 C) (Oral)   Resp 20   SpO2 94%   Visual Acuity Right Eye  Distance:   Left Eye Distance:   Bilateral Distance:    Right Eye Near:   Left Eye Near:    Bilateral Near:     Physical Exam Vitals and nursing note reviewed.  Constitutional:      General: She is not in acute distress.    Appearance: She is well-developed and overweight. She is not ill-appearing or toxic-appearing.  HENT:     Head: Normocephalic and atraumatic.     Right Ear: Hearing, tympanic membrane, ear canal and external  ear normal.     Left Ear: Hearing, tympanic membrane, ear canal and external ear normal.     Nose: Mucosal edema, congestion and rhinorrhea present. Rhinorrhea is clear.     Right Sinus: Maxillary sinus tenderness and frontal sinus tenderness present.     Left Sinus: Maxillary sinus tenderness and frontal sinus tenderness present.     Mouth/Throat:     Lips: Pink.     Mouth: Mucous membranes are moist.     Pharynx: Uvula midline. No oropharyngeal exudate or posterior oropharyngeal erythema.     Tonsils: No tonsillar exudate.  Eyes:     Conjunctiva/sclera: Conjunctivae normal.     Pupils: Pupils are equal, round, and reactive to light.  Cardiovascular:     Rate and Rhythm: Normal rate and regular rhythm.     Heart sounds: S1 normal and S2 normal. No murmur heard. Pulmonary:     Effort: Pulmonary effort is normal. No respiratory distress.     Breath sounds: Normal breath sounds. No decreased breath sounds, wheezing, rhonchi or rales.  Abdominal:     Palpations: Abdomen is soft.     Tenderness: There is no abdominal tenderness.  Musculoskeletal:        General: No swelling.     Cervical back: Neck supple.  Lymphadenopathy:     Head:     Right side of head: No submental, submandibular, tonsillar, preauricular or posterior auricular adenopathy.     Left side of head: No submental, submandibular, tonsillar, preauricular or posterior auricular adenopathy.     Cervical: Cervical adenopathy present.     Right cervical: Superficial cervical adenopathy  present.     Left cervical: Superficial cervical adenopathy present.  Skin:    General: Skin is warm and dry.     Capillary Refill: Capillary refill takes less than 2 seconds.     Findings: No rash.  Neurological:     Mental Status: She is alert and oriented to person, place, and time.  Psychiatric:        Mood and Affect: Mood normal.      UC Treatments / Results  Labs (all labs ordered are listed, but only abnormal results are displayed) Labs Reviewed - No data to display  EKG   Radiology No results found.  Procedures Procedures (including critical care time)  Medications Ordered in UC Medications - No data to display  Initial Impression / Assessment and Plan / UC Course  I have reviewed the triage vital signs and the nursing notes.  Pertinent labs & imaging results that were available during my care of the patient were reviewed by me and considered in my medical decision making (see chart for details).  Exam is consistent with sinusitis.  Not a candidate for fluticasone or other steroid nasal sprays, due to allergies.  Encouraged sinus rinses.  Continue Mucinex OTC.  Cefdinir, 300 mg, twice daily for 10 days.  Get plenty of fluids and rest.  Follow-up if symptoms do not improve, worsen or new symptoms occur. Final Clinical Impressions(s) / UC Diagnoses   Final diagnoses:  Acute recurrent pansinusitis  Sinus pain  Vaginal candidiasis     Discharge Instructions      Exam is consistent with sinusitis.  Encouraged sinus rinses.  Discussed nasal sprays but she has an allergy to dexamethasone and prednisone.  Discouraged use of OTC nasal sprays.  Continue Mucinex as directed on the package.  Cefdinir, 300 mg, twice daily for 10 days.  Get plenty of fluids and  rest.  Follow-up if symptoms do not improve, worsen or new symptoms occur.     ED Prescriptions     Medication Sig Dispense Auth. Provider   cefdinir (OMNICEF) 300 MG capsule Take 1 capsule (300 mg  total) by mouth 2 (two) times daily for 10 days. 20 capsule Prescilla Sours, FNP   fluconazole (DIFLUCAN) 150 MG tablet By mouth today and repeat in 5-7 days for vaginal yeast infection. 2 tablet Prescilla Sours, FNP      PDMP not reviewed this encounter.   Prescilla Sours, FNP 12/05/23 [phone]

## 2023-12-07 ENCOUNTER — Other Ambulatory Visit: Payer: Self-pay | Admitting: Cardiology

## 2023-12-10 DIAGNOSIS — H25813 Combined forms of age-related cataract, bilateral: Secondary | ICD-10-CM | POA: Diagnosis not present

## 2024-02-07 DIAGNOSIS — M1712 Unilateral primary osteoarthritis, left knee: Secondary | ICD-10-CM | POA: Diagnosis not present

## 2024-03-07 DIAGNOSIS — M1712 Unilateral primary osteoarthritis, left knee: Secondary | ICD-10-CM | POA: Diagnosis not present

## 2024-03-16 ENCOUNTER — Other Ambulatory Visit: Payer: Self-pay | Admitting: Cardiology

## 2024-03-16 NOTE — Telephone Encounter (Signed)
 Rx refill sent to pharmacy.

## 2024-04-11 DIAGNOSIS — M1712 Unilateral primary osteoarthritis, left knee: Secondary | ICD-10-CM | POA: Diagnosis not present

## 2024-04-13 ENCOUNTER — Telehealth: Payer: Self-pay

## 2024-04-13 NOTE — Telephone Encounter (Signed)
   Pre-operative Risk Assessment    Patient Name: Sara Ramos  DOB: 1946-04-22 MRN: [id number]   Date of last office visit: 11/26/23 Date of next office visit: N/A   Request for Surgical Clearance    Procedure:  left total knee arthroplasty  Date of Surgery:  Clearance TBD                                Surgeon:  Dr. Harless Lien Surgeon's Group or Practice Name:  [facility] Phone number:  [phone] Fax number:  [phone]   Type of Clearance Requested:   - Medical    Type of Anesthesia:  General    Additional requests/questions:    SignedBascom Lily   04/13/2024, 4:30 PM

## 2024-04-14 ENCOUNTER — Telehealth: Payer: Self-pay

## 2024-04-14 NOTE — Telephone Encounter (Signed)
   Name: Sara Ramos  DOB: Jul 16, 1946  MRN: [id number]  Primary Cardiologist: None   Preoperative team, please contact this patient and set up a phone call appointment for further preoperative risk assessment. Please obtain consent and complete medication review. Thank you for your help.  I confirm that guidance regarding antiplatelet and oral anticoagulation therapy has been completed and, if necessary, noted below (none requested).  I also confirmed the patient resides in the state of [place] . As per [facility] Medical Board telemedicine laws, the patient must reside in the state in which the provider is licensed.   Jude Norton, NP 04/14/2024, 9:25 AM [facility]

## 2024-04-14 NOTE — Telephone Encounter (Signed)
 Med Rec and Consent done.

## 2024-04-14 NOTE — Telephone Encounter (Signed)
 Med Rec and Consent done.    Patient Consent for Virtual Visit        Sara Ramos has provided verbal consent on 04/14/2024 for a virtual visit (video or telephone).   CONSENT FOR VIRTUAL VISIT FOR:  Sara Ramos  By participating in this virtual visit I agree to the following:  I hereby voluntarily request, consent and authorize [facility] and its employed or contracted physicians, physician assistants, nurse practitioners or other licensed health care professionals (the Practitioner), to provide me with telemedicine health care services (the "Services") as deemed necessary by the treating Practitioner. I acknowledge and consent to receive the Services by the Practitioner via telemedicine. I understand that the telemedicine visit will involve communicating with the Practitioner through live audiovisual communication technology and the disclosure of certain medical information by electronic transmission. I acknowledge that I have been given the opportunity to request an in-person assessment or other available alternative prior to the telemedicine visit and am voluntarily participating in the telemedicine visit.  I understand that I have the right to withhold or withdraw my consent to the use of telemedicine in the course of my care at any time, without affecting my right to future care or treatment, and that the Practitioner or I may terminate the telemedicine visit at any time. I understand that I have the right to inspect all information obtained and/or recorded in the course of the telemedicine visit and may receive copies of available information for a reasonable fee.  I understand that some of the potential risks of receiving the Services via telemedicine include:  Delay or interruption in medical evaluation due to technological equipment failure or disruption; Information transmitted may not be sufficient (e.g. poor resolution of images) to allow for appropriate medical decision  making by the Practitioner; and/or  In rare instances, security protocols could fail, causing a breach of personal health information.  Furthermore, I acknowledge that it is my responsibility to provide information about my medical history, conditions and care that is complete and accurate to the best of my ability. I acknowledge that Practitioner's advice, recommendations, and/or decision may be based on factors not within their control, such as incomplete or inaccurate data provided by me or distortions of diagnostic images or specimens that may result from electronic transmissions. I understand that the practice of medicine is not an exact science and that Practitioner makes no warranties or guarantees regarding treatment outcomes. I acknowledge that a copy of this consent can be made available to me via my patient portal [facility] MyChart), or I can request a printed copy by calling the office of [facility].    I understand that my insurance will be billed for this visit.   I have read or had this consent read to me. I understand the contents of this consent, which adequately explains the benefits and risks of the Services being provided via telemedicine.  I have been provided ample opportunity to ask questions regarding this consent and the Services and have had my questions answered to my satisfaction. I give my informed consent for the services to be provided through the use of telemedicine in my medical care

## 2024-04-14 NOTE — Telephone Encounter (Signed)
 Left message for pt to call our office and ask for the preop team to schedule TELE Preop appt.

## 2024-04-14 NOTE — Telephone Encounter (Signed)
 S/W pt and scheduled TELE Preop appt 04/21/24  Pt stated that Procedure date is set for 05/15/24.

## 2024-04-19 DIAGNOSIS — Z01818 Encounter for other preprocedural examination: Secondary | ICD-10-CM | POA: Diagnosis not present

## 2024-04-19 DIAGNOSIS — Z6835 Body mass index (BMI) 35.0-35.9, adult: Secondary | ICD-10-CM | POA: Diagnosis not present

## 2024-04-19 DIAGNOSIS — E669 Obesity, unspecified: Secondary | ICD-10-CM | POA: Diagnosis not present

## 2024-04-19 DIAGNOSIS — M170 Bilateral primary osteoarthritis of knee: Secondary | ICD-10-CM | POA: Diagnosis not present

## 2024-04-21 ENCOUNTER — Ambulatory Visit: Attending: Cardiovascular Disease | Admitting: Nurse Practitioner

## 2024-04-21 ENCOUNTER — Encounter: Payer: Self-pay | Admitting: Nurse Practitioner

## 2024-04-21 DIAGNOSIS — Z01810 Encounter for preprocedural cardiovascular examination: Secondary | ICD-10-CM | POA: Insufficient documentation

## 2024-04-21 NOTE — Progress Notes (Signed)
 Virtual Visit via Telephone Note   Because of Sara Ramos co-morbid illnesses, she is at least at moderate risk for complications without adequate follow up.  This format is felt to be most appropriate for this patient at this time.  Due to technical limitations with video connection Web designer), today's appointment will be conducted as an audio only telehealth visit, and Sara Ramos verbally agreed to proceed in this manner.   All issues noted in this document were discussed and addressed.  No physical exam could be performed with this format.  Evaluation Performed:  Preoperative cardiovascular risk assessment _____________   Date:  04/21/2024   Patient ID:  Sara Ramos, DOB 18-Aug-1946, MRN [id number] Patient Location:  Home Provider location:   Office  Primary Care Provider:  Street, Renford Cartwright, MD Primary Cardiologist:  Zoe Hinds, MD  Chief Complaint / Patient Profile   78 y.o. y/o female with a h/o CAD with coronary calcium  score of 75 (66th percentile) with less than 25% stenosis LAD, obesity, hypertension, hyperlipidemia, palpitations who is pending left total knee arthroplasty with Dr. Ozell Blunt on date TBD and presents today for telephonic preoperative cardiovascular risk assessment.  History of Present Illness    Sara Ramos is a 78 y.o. female who presents via audio/video conferencing for a telehealth visit today.  Pt was last seen in cardiology clinic on 11/26/23 by Dr. Sandee Crook.  At that time Sara Ramos was doing well.  The patient is now pending procedure as outlined above. Since her last visit, she denies chest pain, shortness of breath, lower extremity edema, fatigue, palpitations, melena, hematuria, hemoptysis, diaphoresis, weakness, presyncope, syncope, orthopnea, and PND. She is active with keeping her great-grandchild and house work and is able to achieve > 4 METS activity without concerning cardiac symptoms.   Past Medical History    Past Medical  History:  Diagnosis Date   Aortic atherosclerosis (HCC)    Benign essential hypertension    Coronary artery disease involving native coronary artery of native heart without angina pectoris 07/05/2020   Cystocele with rectocele 04/04/2021   Dyslipidemia    Essential hypertension 07/05/2020   GERD (gastroesophageal reflux disease)    Lumbar radiculopathy 09/29/2022   Mixed hyperlipidemia 07/05/2020   Morbid obesity (HCC)    Obesity (BMI 30-39.9) 07/05/2020   Primary osteoarthritis of knees, bilateral    Rectocele 04/04/2021   Renal cyst, left    Stenosis of celiac artery (HCC) 07/05/2020   Tachycardia 08/19/2022   Weakening of rectovaginal tissue 04/04/2021   Past Surgical History:  Procedure Laterality Date   CHOLECYSTECTOMY  1991   KNEE SURGERY     TONSILLECTOMY      Allergies  Allergies  Allergen Reactions   Aspirin     Cipro [Ciprofloxacin In D5w]    Dexamethasone     Dyclonine-Fd&C Blue #1-Fd&C Red #40-Menth [Dyclonine Hcl]     Yellow #5   Niacin And Related    Nitroglycerin    Prednisone    Proxyphylline    Septra [Sulfamethoxazole-Trimethoprim]    Vioxx [Rofecoxib]     Home Medications    Prior to Admission medications   Medication Sig Start Date End Date Taking? Authorizing Provider  atorvastatin  (LIPITOR) 40 MG tablet TAKE 1 TABLET(40 MG) BY MOUTH DAILY 06/13/21   Tobb, Kardie, DO  carvedilol  (COREG ) 12.5 MG tablet TAKE 1 TABLET(12.5 MG) BY MOUTH TWICE DAILY 05/14/23   Hassan Links, MD  cyanocobalamin 100 MCG tablet Take 100 mcg by mouth daily.  04/04/21   [provider]  diphenhydrAMINE HCl (BENADRYL ALLERGY PO) Take 1 tablet by mouth daily as needed (Allergies).    [provider]  docusate sodium (STOOL SOFTENER) 100 MG capsule Take 100 mg by mouth at bedtime.    [provider]  fluconazole  (DIFLUCAN ) 150 MG tablet By mouth today and repeat in 5-7 days for vaginal yeast infection. 12/05/23   Guss Legacy, FNP  Melatonin 1 MG CAPS  Take by mouth at bedtime.    [provider]  omeprazole (PRILOSEC) 20 MG capsule Take 20 mg by mouth daily. 04/04/21   [provider]  omeprazole (PRILOSEC) 20 MG capsule Take 20 mg by mouth as needed (heartburn).    [provider]  ranolazine  (RANEXA ) 500 MG 12 hr tablet Take 1 tablet (500 mg total) by mouth 2 (two) times daily. 03/16/24   Hassan Links, MD  rosuvastatin (CRESTOR) 10 MG tablet Take 10 mg by mouth daily.    [provider]  valsartan -hydrochlorothiazide  (DIOVAN -HCT) 80-12.5 MG tablet Take 1 tablet by mouth daily. 12/07/23   Hassan Links, MD    Physical Exam    Vital Signs:  Sara Ramos does not have vital signs available for review today.  Given telephonic nature of communication, physical exam is limited. AAOx3. NAD. Normal affect.  Speech and respirations are unlabored.  Accessory Clinical Findings    None  Assessment & Plan    1.  Preoperative Cardiovascular Risk Assessment:According to the Revised Cardiac Risk Index (RCRI), her Perioperative Risk of Major Cardiac Event is (%): 0.9 which places her at low risk. Her Functional Capacity in METs is: 6.7 according to the Duke Activity Status Index (DASI). The patient is doing well from a cardiac perspective. Therefore, based on ACC/AHA guidelines, the patient would be at acceptable risk for the planned procedure without further cardiovascular testing.   The patient was advised that if she develops new symptoms prior to surgery to contact our office to arrange for a follow-up visit, and she verbalized understanding.  No request to hold cardiac medications  A copy of this note will be routed to requesting surgeon.  Time:   Today, I have spent 10 minutes with the patient with telehealth technology discussing medical history, symptoms, and management plan.     Gerldine Koch, NP-C  04/21/2024, 3:24 PM [address] [place], [place] [address] Office  [phone] Fax [phone]

## 2024-04-24 DIAGNOSIS — R9431 Abnormal electrocardiogram [ECG] [EKG]: Secondary | ICD-10-CM | POA: Diagnosis not present

## 2024-04-24 DIAGNOSIS — Z01818 Encounter for other preprocedural examination: Secondary | ICD-10-CM | POA: Diagnosis not present

## 2024-04-24 DIAGNOSIS — Z79899 Other long term (current) drug therapy: Secondary | ICD-10-CM | POA: Diagnosis not present

## 2024-04-24 DIAGNOSIS — E559 Vitamin D deficiency, unspecified: Secondary | ICD-10-CM | POA: Diagnosis not present

## 2024-04-24 DIAGNOSIS — M79609 Pain in unspecified limb: Secondary | ICD-10-CM | POA: Diagnosis not present

## 2024-05-12 ENCOUNTER — Other Ambulatory Visit: Payer: Self-pay | Admitting: Cardiology

## 2024-05-15 DIAGNOSIS — E7800 Pure hypercholesterolemia, unspecified: Secondary | ICD-10-CM | POA: Diagnosis not present

## 2024-05-15 DIAGNOSIS — Z87891 Personal history of nicotine dependence: Secondary | ICD-10-CM | POA: Diagnosis not present

## 2024-05-15 DIAGNOSIS — R0683 Snoring: Secondary | ICD-10-CM | POA: Diagnosis not present

## 2024-05-15 DIAGNOSIS — I10 Essential (primary) hypertension: Secondary | ICD-10-CM | POA: Diagnosis not present

## 2024-05-15 DIAGNOSIS — M1712 Unilateral primary osteoarthritis, left knee: Secondary | ICD-10-CM | POA: Diagnosis not present

## 2024-05-15 DIAGNOSIS — G629 Polyneuropathy, unspecified: Secondary | ICD-10-CM | POA: Diagnosis not present

## 2024-05-15 DIAGNOSIS — K5909 Other constipation: Secondary | ICD-10-CM | POA: Diagnosis not present

## 2024-05-15 DIAGNOSIS — K219 Gastro-esophageal reflux disease without esophagitis: Secondary | ICD-10-CM | POA: Diagnosis not present

## 2024-05-15 DIAGNOSIS — Z96652 Presence of left artificial knee joint: Secondary | ICD-10-CM | POA: Diagnosis not present

## 2024-05-15 DIAGNOSIS — Z6835 Body mass index (BMI) 35.0-35.9, adult: Secondary | ICD-10-CM | POA: Diagnosis not present

## 2024-05-15 DIAGNOSIS — Z471 Aftercare following joint replacement surgery: Secondary | ICD-10-CM | POA: Diagnosis not present

## 2024-05-15 DIAGNOSIS — Z79899 Other long term (current) drug therapy: Secondary | ICD-10-CM | POA: Diagnosis not present

## 2024-05-15 DIAGNOSIS — G8918 Other acute postprocedural pain: Secondary | ICD-10-CM | POA: Diagnosis not present

## 2024-05-15 DIAGNOSIS — E669 Obesity, unspecified: Secondary | ICD-10-CM | POA: Diagnosis not present

## 2024-05-16 DIAGNOSIS — E7800 Pure hypercholesterolemia, unspecified: Secondary | ICD-10-CM | POA: Diagnosis not present

## 2024-05-16 DIAGNOSIS — K5909 Other constipation: Secondary | ICD-10-CM | POA: Diagnosis not present

## 2024-05-16 DIAGNOSIS — G629 Polyneuropathy, unspecified: Secondary | ICD-10-CM | POA: Diagnosis not present

## 2024-05-16 DIAGNOSIS — M1712 Unilateral primary osteoarthritis, left knee: Secondary | ICD-10-CM | POA: Diagnosis not present

## 2024-05-16 DIAGNOSIS — I10 Essential (primary) hypertension: Secondary | ICD-10-CM | POA: Diagnosis not present

## 2024-05-16 DIAGNOSIS — K219 Gastro-esophageal reflux disease without esophagitis: Secondary | ICD-10-CM | POA: Diagnosis not present

## 2024-05-17 DIAGNOSIS — K449 Diaphragmatic hernia without obstruction or gangrene: Secondary | ICD-10-CM | POA: Diagnosis not present

## 2024-05-17 DIAGNOSIS — K649 Unspecified hemorrhoids: Secondary | ICD-10-CM | POA: Diagnosis not present

## 2024-05-17 DIAGNOSIS — I2510 Atherosclerotic heart disease of native coronary artery without angina pectoris: Secondary | ICD-10-CM | POA: Diagnosis not present

## 2024-05-17 DIAGNOSIS — K5730 Diverticulosis of large intestine without perforation or abscess without bleeding: Secondary | ICD-10-CM | POA: Diagnosis not present

## 2024-05-17 DIAGNOSIS — K551 Chronic vascular disorders of intestine: Secondary | ICD-10-CM | POA: Diagnosis not present

## 2024-05-17 DIAGNOSIS — I700 Atherosclerosis of aorta: Secondary | ICD-10-CM | POA: Diagnosis not present

## 2024-05-17 DIAGNOSIS — Z96652 Presence of left artificial knee joint: Secondary | ICD-10-CM | POA: Diagnosis not present

## 2024-05-17 DIAGNOSIS — Z6835 Body mass index (BMI) 35.0-35.9, adult: Secondary | ICD-10-CM | POA: Diagnosis not present

## 2024-05-17 DIAGNOSIS — E7800 Pure hypercholesterolemia, unspecified: Secondary | ICD-10-CM | POA: Diagnosis not present

## 2024-05-17 DIAGNOSIS — K589 Irritable bowel syndrome without diarrhea: Secondary | ICD-10-CM | POA: Diagnosis not present

## 2024-05-17 DIAGNOSIS — I351 Nonrheumatic aortic (valve) insufficiency: Secondary | ICD-10-CM | POA: Diagnosis not present

## 2024-05-17 DIAGNOSIS — G63 Polyneuropathy in diseases classified elsewhere: Secondary | ICD-10-CM | POA: Diagnosis not present

## 2024-05-17 DIAGNOSIS — I119 Hypertensive heart disease without heart failure: Secondary | ICD-10-CM | POA: Diagnosis not present

## 2024-05-17 DIAGNOSIS — Z7982 Long term (current) use of aspirin: Secondary | ICD-10-CM | POA: Diagnosis not present

## 2024-05-17 DIAGNOSIS — K219 Gastro-esophageal reflux disease without esophagitis: Secondary | ICD-10-CM | POA: Diagnosis not present

## 2024-05-17 DIAGNOSIS — N281 Cyst of kidney, acquired: Secondary | ICD-10-CM | POA: Diagnosis not present

## 2024-05-17 DIAGNOSIS — H9190 Unspecified hearing loss, unspecified ear: Secondary | ICD-10-CM | POA: Diagnosis not present

## 2024-05-17 DIAGNOSIS — E669 Obesity, unspecified: Secondary | ICD-10-CM | POA: Diagnosis not present

## 2024-05-17 DIAGNOSIS — D500 Iron deficiency anemia secondary to blood loss (chronic): Secondary | ICD-10-CM | POA: Diagnosis not present

## 2024-05-17 DIAGNOSIS — G8929 Other chronic pain: Secondary | ICD-10-CM | POA: Diagnosis not present

## 2024-05-17 DIAGNOSIS — I701 Atherosclerosis of renal artery: Secondary | ICD-10-CM | POA: Diagnosis not present

## 2024-05-17 DIAGNOSIS — Z471 Aftercare following joint replacement surgery: Secondary | ICD-10-CM | POA: Diagnosis not present

## 2024-05-17 DIAGNOSIS — M47816 Spondylosis without myelopathy or radiculopathy, lumbar region: Secondary | ICD-10-CM | POA: Diagnosis not present

## 2024-05-17 DIAGNOSIS — M1711 Unilateral primary osteoarthritis, right knee: Secondary | ICD-10-CM | POA: Diagnosis not present

## 2024-05-17 DIAGNOSIS — I739 Peripheral vascular disease, unspecified: Secondary | ICD-10-CM | POA: Diagnosis not present

## 2024-05-18 DIAGNOSIS — Z471 Aftercare following joint replacement surgery: Secondary | ICD-10-CM | POA: Diagnosis not present

## 2024-05-18 DIAGNOSIS — G63 Polyneuropathy in diseases classified elsewhere: Secondary | ICD-10-CM | POA: Diagnosis not present

## 2024-05-18 DIAGNOSIS — M1711 Unilateral primary osteoarthritis, right knee: Secondary | ICD-10-CM | POA: Diagnosis not present

## 2024-05-18 DIAGNOSIS — I119 Hypertensive heart disease without heart failure: Secondary | ICD-10-CM | POA: Diagnosis not present

## 2024-05-18 DIAGNOSIS — I739 Peripheral vascular disease, unspecified: Secondary | ICD-10-CM | POA: Diagnosis not present

## 2024-05-18 DIAGNOSIS — M47816 Spondylosis without myelopathy or radiculopathy, lumbar region: Secondary | ICD-10-CM | POA: Diagnosis not present

## 2024-05-22 DIAGNOSIS — I119 Hypertensive heart disease without heart failure: Secondary | ICD-10-CM | POA: Diagnosis not present

## 2024-05-22 DIAGNOSIS — M47816 Spondylosis without myelopathy or radiculopathy, lumbar region: Secondary | ICD-10-CM | POA: Diagnosis not present

## 2024-05-22 DIAGNOSIS — G63 Polyneuropathy in diseases classified elsewhere: Secondary | ICD-10-CM | POA: Diagnosis not present

## 2024-05-22 DIAGNOSIS — Z471 Aftercare following joint replacement surgery: Secondary | ICD-10-CM | POA: Diagnosis not present

## 2024-05-22 DIAGNOSIS — M1711 Unilateral primary osteoarthritis, right knee: Secondary | ICD-10-CM | POA: Diagnosis not present

## 2024-05-22 DIAGNOSIS — I739 Peripheral vascular disease, unspecified: Secondary | ICD-10-CM | POA: Diagnosis not present

## 2024-05-24 DIAGNOSIS — G63 Polyneuropathy in diseases classified elsewhere: Secondary | ICD-10-CM | POA: Diagnosis not present

## 2024-05-24 DIAGNOSIS — Z471 Aftercare following joint replacement surgery: Secondary | ICD-10-CM | POA: Diagnosis not present

## 2024-05-24 DIAGNOSIS — M47816 Spondylosis without myelopathy or radiculopathy, lumbar region: Secondary | ICD-10-CM | POA: Diagnosis not present

## 2024-05-24 DIAGNOSIS — I119 Hypertensive heart disease without heart failure: Secondary | ICD-10-CM | POA: Diagnosis not present

## 2024-05-24 DIAGNOSIS — M1711 Unilateral primary osteoarthritis, right knee: Secondary | ICD-10-CM | POA: Diagnosis not present

## 2024-05-24 DIAGNOSIS — I739 Peripheral vascular disease, unspecified: Secondary | ICD-10-CM | POA: Diagnosis not present

## 2024-05-26 DIAGNOSIS — I739 Peripheral vascular disease, unspecified: Secondary | ICD-10-CM | POA: Diagnosis not present

## 2024-05-26 DIAGNOSIS — M47816 Spondylosis without myelopathy or radiculopathy, lumbar region: Secondary | ICD-10-CM | POA: Diagnosis not present

## 2024-05-26 DIAGNOSIS — M1711 Unilateral primary osteoarthritis, right knee: Secondary | ICD-10-CM | POA: Diagnosis not present

## 2024-05-26 DIAGNOSIS — Z471 Aftercare following joint replacement surgery: Secondary | ICD-10-CM | POA: Diagnosis not present

## 2024-05-26 DIAGNOSIS — G63 Polyneuropathy in diseases classified elsewhere: Secondary | ICD-10-CM | POA: Diagnosis not present

## 2024-05-26 DIAGNOSIS — I119 Hypertensive heart disease without heart failure: Secondary | ICD-10-CM | POA: Diagnosis not present

## 2024-06-01 DIAGNOSIS — M25662 Stiffness of left knee, not elsewhere classified: Secondary | ICD-10-CM | POA: Diagnosis not present

## 2024-06-01 DIAGNOSIS — M25462 Effusion, left knee: Secondary | ICD-10-CM | POA: Diagnosis not present

## 2024-06-01 DIAGNOSIS — R2689 Other abnormalities of gait and mobility: Secondary | ICD-10-CM | POA: Diagnosis not present

## 2024-06-01 DIAGNOSIS — M25562 Pain in left knee: Secondary | ICD-10-CM | POA: Diagnosis not present

## 2024-06-07 DIAGNOSIS — M25562 Pain in left knee: Secondary | ICD-10-CM | POA: Diagnosis not present

## 2024-06-07 DIAGNOSIS — M25462 Effusion, left knee: Secondary | ICD-10-CM | POA: Diagnosis not present

## 2024-06-07 DIAGNOSIS — R2689 Other abnormalities of gait and mobility: Secondary | ICD-10-CM | POA: Diagnosis not present

## 2024-06-07 DIAGNOSIS — M25662 Stiffness of left knee, not elsewhere classified: Secondary | ICD-10-CM | POA: Diagnosis not present

## 2024-06-09 DIAGNOSIS — M25462 Effusion, left knee: Secondary | ICD-10-CM | POA: Diagnosis not present

## 2024-06-09 DIAGNOSIS — M25662 Stiffness of left knee, not elsewhere classified: Secondary | ICD-10-CM | POA: Diagnosis not present

## 2024-06-09 DIAGNOSIS — M25562 Pain in left knee: Secondary | ICD-10-CM | POA: Diagnosis not present

## 2024-06-09 DIAGNOSIS — R2689 Other abnormalities of gait and mobility: Secondary | ICD-10-CM | POA: Diagnosis not present

## 2024-06-30 DIAGNOSIS — M1712 Unilateral primary osteoarthritis, left knee: Secondary | ICD-10-CM | POA: Diagnosis not present

## 2024-06-30 DIAGNOSIS — Z96652 Presence of left artificial knee joint: Secondary | ICD-10-CM | POA: Diagnosis not present

## 2024-07-20 DIAGNOSIS — Z1339 Encounter for screening examination for other mental health and behavioral disorders: Secondary | ICD-10-CM | POA: Diagnosis not present

## 2024-07-20 DIAGNOSIS — E559 Vitamin D deficiency, unspecified: Secondary | ICD-10-CM | POA: Diagnosis not present

## 2024-07-20 DIAGNOSIS — E785 Hyperlipidemia, unspecified: Secondary | ICD-10-CM | POA: Diagnosis not present

## 2024-07-20 DIAGNOSIS — Z23 Encounter for immunization: Secondary | ICD-10-CM | POA: Diagnosis not present

## 2024-07-20 DIAGNOSIS — M170 Bilateral primary osteoarthritis of knee: Secondary | ICD-10-CM | POA: Diagnosis not present

## 2024-07-20 DIAGNOSIS — D497 Neoplasm of unspecified behavior of endocrine glands and other parts of nervous system: Secondary | ICD-10-CM | POA: Diagnosis not present

## 2024-07-20 DIAGNOSIS — Z0000 Encounter for general adult medical examination without abnormal findings: Secondary | ICD-10-CM | POA: Diagnosis not present

## 2024-07-20 DIAGNOSIS — K2960 Other gastritis without bleeding: Secondary | ICD-10-CM | POA: Diagnosis not present

## 2024-08-11 DIAGNOSIS — M1712 Unilateral primary osteoarthritis, left knee: Secondary | ICD-10-CM | POA: Diagnosis not present

## 2024-08-11 DIAGNOSIS — Z96652 Presence of left artificial knee joint: Secondary | ICD-10-CM | POA: Diagnosis not present

## 2024-11-11 ENCOUNTER — Other Ambulatory Visit: Payer: Self-pay | Admitting: Cardiology

## 2024-11-23 ENCOUNTER — Other Ambulatory Visit: Payer: Self-pay

## 2024-11-27 NOTE — Progress Notes (Unsigned)
 " Cardiology Office Note:    Date:  11/29/2024   ID:  Sara Ramos, DOB 01-Apr-1946, MRN [id number]  PCP:  Street, Lonni HERO, MD  Cardiologist:  Redell Leiter, MD    Referring MD: Street, Lonni HERO, MD    ASSESSMENT:    1. Mild CAD   2. Agatston coronary artery calcium  score less than 100   3. Essential hypertension   4. Mixed hyperlipidemia    PLAN:    In order of problems listed above:  [name] is doing well from the perspective of CAD no anginal discomfort not on aspirin  due to allergy continue her beta-blocker ranolazine  and lipid-lowering rosuvastatin Hypertension is not well-controlled start low-dose loop diuretic 2 days a week in addition to her ARB thiazide beta-blocker should also help with her chronic venous insufficiency and edema 2 weeks check renal function Recheck her lipids at the same time   Next appointment: 1 year   Medication Adjustments/Labs and Tests Ordered: Current medicines are reviewed at length with the patient today.  Concerns regarding medicines are outlined above.  Orders Placed This Encounter  Procedures   EKG 12-Lead   No orders of the defined types were placed in this encounter.    History of Present Illness:    Sara Ramos is a 79 y.o. female with a hx of CAD coronary calcium  score at 76/66 percentile minimal CAD less than 25% stenosis left anterior descending coronary artery hypertension hyperlipidemia and palpitation last seen 11/26/2023.  Compliance with diet, lifestyle and medications: Yes  She has continued or worsened peripheral edema chronic venous insufficiency Blood pressure is not well-controlled and have her take a loop diuretic 2 days a week which I think will help with both problems She has been switched atorvastatin  to rosuvastatin in 2 weeks we will also check a lipid profile Is not taking aspirin  because she has allergy Past Medical History:  Diagnosis Date   Aortic atherosclerosis    Benign essential  hypertension    Coronary artery disease involving native coronary artery of native heart without angina pectoris 07/05/2020   Cystocele with rectocele 04/04/2021   Dyslipidemia    Essential hypertension 07/05/2020   GERD (gastroesophageal reflux disease)    Lumbar radiculopathy 09/29/2022   Mixed hyperlipidemia 07/05/2020   Morbid obesity (HCC)    Obesity (BMI 30-39.9) 07/05/2020   Primary osteoarthritis of knees, bilateral    Rectocele 04/04/2021   Renal cyst, left    Stenosis of celiac artery 07/05/2020   Tachycardia 08/19/2022   Weakening of rectovaginal tissue 04/04/2021    Current Medications: Active Medications[1]    EKGs/Labs/Other Studies Reviewed:    The following studies were reviewed today:  Cardiac Studies & Procedures   ______________________________________________________________________________________________          CT SCANS  CT CORONARY MORPH W/CTA COR W/SCORE 07/02/2020  Addendum 07/02/2020  4:09 PM ADDENDUM REPORT: 07/02/2020 16:07  CLINICAL DATA:  79 year old female with chest pain. Medical history includes hypertension, hyperlipidemia, obesity and family history of premature coronary artery disease.  EXAM: Cardiac/Coronary  CT  TECHNIQUE: The patient was scanned on a [employer].  FINDINGS: A 120 kV prospective scan was triggered in the descending thoracic aorta at 111 HU's. Axial non-contrast 3 mm slices were carried out through the heart. The data set was analyzed on a dedicated work station and scored using the Agatson method. Gantry rotation speed was 250 msecs and collimation was .6 mm. No beta blockade and 0.8 mg of sl  NTG was given. The 3D data set was reconstructed in 5% intervals of the 67-82 % of the R-R cycle. Diastolic phases were analyzed on a dedicated work station using MPR, MIP and VRT modes. The patient received 80 cc of contrast.  Aorta: Normal size. Mild calcifications in the proximal ascending aorta.  No dissection.  Aortic Valve:  Trileaflet.  No calcifications.  Coronary Arteries:  Normal coronary origin.  Right dominance.  RCA is a large dominant artery that gives rise to PDA and PLVB. There is minimal (<24%) calcified plaques in the proximal to mid portion of the vessel. The distal portion with no plaques.  Left main is a large artery that gives rise to LAD and LCX arteries.  LAD is a large vessel. The mid LAD with minimal (<24%) mixed plaque. The proximal and distal portion of LAD with no plaques.  LCX is a non-dominant artery that gives rise to one large OM1 branch. There is no plaque.  Other findings:  Normal pulmonary vein drainage into the left atrium.  Normal left atrial appendage without a thrombus.  Normal size of the pulmonary artery.  IMPRESSION: 1. Coronary calcium  score of 75. This was 50 percentile for age and sex matched control.  2. Normal coronary origin with right dominance.  3. Minimal CAD. Medical management is recommended.  4. Aortic atherosclerosis.  Kardie Tobb, DO   Electronically Signed By: Kardie  Tobb DO On: 07/02/2020 16:07  Narrative EXAM: OVER-READ INTERPRETATION  CT CHEST  The following report is an over-read performed by radiologist Dr. Rockey Kilts of [facility] on 07/02/2020. This over-read does not include interpretation of cardiac or coronary anatomy or pathology. The coronary CTA interpretation by the cardiologist is attached.  COMPARISON:  05/11/2020 CTA chest from [facility]  FINDINGS: Vascular: Aortic atherosclerosis. Tortuous thoracic aorta. No central pulmonary embolism, on this non-dedicated study.  Mediastinum/Nodes: No imaged thoracic adenopathy. Tiny hiatal hernia.  Lungs/Pleura: No pleural fluid.  Clear imaged lungs.  Upper Abdomen: Normal imaged portions of the liver, spleen,.  Musculoskeletal: No acute osseous abnormality. Mild right hemidiaphragm elevation.  IMPRESSION: 1.  No acute findings in the imaged extracardiac chest. 2. Tiny hiatal hernia. 3. Aortic Atherosclerosis (ICD10-I70.0).  Electronically Signed: By: Rockey Kilts M.D. On: 07/02/2020 13:34     ______________________________________________________________________________________________      EKG Interpretation Date/Time:  Wednesday November 29 2024 16:06:19 EST Ventricular Rate:  66 PR Interval:  166 QRS Duration:  88 QT Interval:  416 QTC Calculation: 436 R Axis:   -42  Text Interpretation: Normal sinus rhythm Left axis deviation Late transition (cited on or before 29-Nov-2024) When compared with ECG of 26-Nov-2023 08:22, No significant change was found Confirmed by Monetta Rogue ([id number]) on 11/29/2024 4:19:29 PM   Recent Labs:  September 2025 cholesterol 170 HDL 54 non-HDL cholesterol 116  Physical Exam:    VS:  BP (!) 148/88   Pulse 66   Ht 5' 3 (1.6 m)   Wt 203 lb 12.8 oz (92.4 kg)   SpO2 97%   BMI 36.10 kg/m     Wt Readings from Last 3 Encounters:  11/29/24 203 lb 12.8 oz (92.4 kg)  11/26/23 203 lb 9.6 oz (92.4 kg)  09/01/23 196 lb 12.8 oz (89.3 kg)     GEN:  Well nourished, well developed in no acute distress HEENT: Normal NECK: No JVD; No carotid bruits LYMPHATICS: No lymphadenopathy CARDIAC: RRR, no murmurs, rubs, gallops RESPIRATORY:  Clear to auscultation without rales, wheezing or rhonchi  ABDOMEN: Soft, non-tender, non-distended MUSCULOSKELETAL:  No edema; No deformity  SKIN: Warm and dry NEUROLOGIC:  Alert and oriented x 3 PSYCHIATRIC:  Normal affect    Signed, Redell Leiter, MD  11/29/2024 4:39 PM    [facility]      [1]  Current Meds  Medication Sig   carvedilol  (COREG ) 12.5 MG tablet TAKE 1 TABLET(12.5 MG) BY MOUTH TWICE DAILY. Please keep scheduled 1 year follow-up for additional refills.   diphenhydrAMINE HCl (BENADRYL ALLERGY PO) Take 1 tablet by mouth daily as needed (Allergies).   diphenhydramine-acetaminophen   (TYLENOL  PM EXTRA STRENGTH) 25-500 MG TABS tablet 1 tablet at bedtime as needed.   docusate sodium (STOOL SOFTENER) 100 MG capsule Take 100 mg by mouth at bedtime.   Melatonin 1 MG CAPS Take by mouth at bedtime.   ranolazine  (RANEXA ) 500 MG 12 hr tablet Take 1 tablet (500 mg total) by mouth 2 (two) times daily.   rosuvastatin (CRESTOR) 5 MG tablet Take 5 mg by mouth daily.   valsartan -hydrochlorothiazide  (DIOVAN -HCT) 80-12.5 MG tablet Take 1 tablet by mouth daily.   "

## 2024-11-29 ENCOUNTER — Ambulatory Visit: Attending: Cardiology | Admitting: Cardiology

## 2024-11-29 ENCOUNTER — Encounter: Payer: Self-pay | Admitting: Cardiology

## 2024-11-29 VITALS — BP 148/88 | HR 66 | Ht 63.0 in | Wt 203.8 lb

## 2024-11-29 DIAGNOSIS — I2510 Atherosclerotic heart disease of native coronary artery without angina pectoris: Secondary | ICD-10-CM | POA: Diagnosis present

## 2024-11-29 DIAGNOSIS — R931 Abnormal findings on diagnostic imaging of heart and coronary circulation: Secondary | ICD-10-CM | POA: Insufficient documentation

## 2024-11-29 DIAGNOSIS — I10 Essential (primary) hypertension: Secondary | ICD-10-CM | POA: Diagnosis not present

## 2024-11-29 DIAGNOSIS — E782 Mixed hyperlipidemia: Secondary | ICD-10-CM | POA: Diagnosis not present

## 2024-11-29 MED ORDER — FUROSEMIDE 20 MG PO TABS: 20.0000 mg | ORAL_TABLET | ORAL | 3 refills | Status: AC

## 2024-11-29 NOTE — Patient Instructions (Signed)
 Medication Instructions:  Your physician has recommended you make the following change in your medication:   START: Furosemide  20 mg two times weekly   *If you need a refill on your cardiac medications before your next appointment, please call your pharmacy*  Lab Work: Your physician recommends that you return for lab work in:   Labs in 2 weeks: CMP, Lipids, Apo B  If you have labs (blood work) drawn today and your tests are completely normal, you will receive your results only by: MyChart Message (if you have MyChart) OR A paper copy in the mail If you have any lab test that is abnormal or we need to change your treatment, we will call you to review the results.  Testing/Procedures: None  Follow-Up: At [facility], you and your health needs are our priority.  As part of our continuing mission to provide you with exceptional heart care, our providers are all part of one team.  This team includes your primary Cardiologist (physician) and Advanced Practice Providers or APPs (Physician Assistants and Nurse Practitioners) who all work together to provide you with the care you need, when you need it.  Your next appointment:   1 year(s)  Provider:   Redell Leiter, MD    We recommend signing up for the patient portal called MyChart.  Sign up information is provided on this After Visit Summary.  MyChart is used to connect with patients for Virtual Visits (Telemedicine).  Patients are able to view lab/test results, encounter notes, upcoming appointments, etc.  Non-urgent messages can be sent to your provider as well.   To learn more about what you can do with MyChart, go to [email or web address].   Other Instructions Please keep a BP log for 2 weeks and send by MyChart or mail.                      Dr. Leiter [address] [place], [place] [address]  Blood Pressure Record Sheet To take your blood pressure, you will need a blood pressure machine. You can buy a blood pressure  machine (blood pressure monitor) at your clinic, drug store, or online. When choosing one, consider: An automatic monitor that has an arm cuff. A cuff that wraps snugly around your upper arm. You should be able to fit only one finger between your arm and the cuff. A device that stores blood pressure reading results. Do not choose a monitor that measures your blood pressure from your wrist or finger. Follow your health care provider's instructions for how to take your blood pressure. To use this form: Get one reading in the morning (a.m.) 1-2 hours after you take any medicines. Get one reading in the evening (p.m.) before supper.   Blood pressure log Date: _______________________  a.m. _____________________(1st reading) HR___________            p.m. _____________________(2nd reading) HR__________  Date: _______________________  a.m. _____________________(1st reading) HR___________            p.m. _____________________(2nd reading) HR__________  Date: _______________________  a.m. _____________________(1st reading) HR___________            p.m. _____________________(2nd reading) HR__________  Date: _______________________  a.m. _____________________(1st reading) HR___________            p.m. _____________________(2nd reading) HR__________  Date: _______________________  a.m. _____________________(1st reading) HR___________            p.m. _____________________(2nd reading) HR__________  Date: _______________________  a.m. _____________________(1st reading) HR___________  p.m. _____________________(2nd reading) HR__________  Date: _______________________  a.m. _____________________(1st reading) HR___________            p.m. _____________________(2nd reading) HR__________   This information is not intended to replace advice given to you by your health care provider. Make sure you discuss any questions you have with your health care provider. Document  Revised: 02/14/2020 Document Reviewed: 02/14/2020 Elsevier Patient Education  2021 [employer].

## 2024-12-05 ENCOUNTER — Other Ambulatory Visit: Payer: Self-pay | Admitting: Cardiology

## 2024-12-10 ENCOUNTER — Other Ambulatory Visit: Payer: Self-pay | Admitting: Cardiology

## 2024-12-11 ENCOUNTER — Other Ambulatory Visit: Payer: Self-pay | Admitting: Cardiology
